# Patient Record
Sex: Female | Born: 1960 | Race: White | Hispanic: No | Marital: Married | Smoking: Never smoker
Health system: Southern US, Community
[De-identification: ages and names within clinical notes are randomized; demographics above are authoritative.]

## PROBLEM LIST (undated history)

## (undated) HISTORY — PX: ABDOMINAL HYSTERECTOMY: SHX81

## (undated) HISTORY — PX: AUGMENTATION MAMMAPLASTY: SUR837

---

## 1998-01-31 ENCOUNTER — Other Ambulatory Visit: Admission: RE | Admit: 1998-01-31 | Discharge: 1998-01-31 | Payer: Self-pay | Admitting: Obstetrics and Gynecology

## 1998-08-14 ENCOUNTER — Inpatient Hospital Stay (HOSPITAL_COMMUNITY): Admission: AD | Admit: 1998-08-14 | Discharge: 1998-08-16 | Payer: Self-pay | Admitting: Obstetrics and Gynecology

## 1998-09-17 ENCOUNTER — Other Ambulatory Visit: Admission: RE | Admit: 1998-09-17 | Discharge: 1998-09-17 | Payer: Self-pay | Admitting: Obstetrics and Gynecology

## 1998-10-15 ENCOUNTER — Other Ambulatory Visit: Admission: RE | Admit: 1998-10-15 | Discharge: 1998-10-15 | Payer: Self-pay | Admitting: Obstetrics and Gynecology

## 1999-05-20 ENCOUNTER — Other Ambulatory Visit: Admission: RE | Admit: 1999-05-20 | Discharge: 1999-05-20 | Payer: Self-pay | Admitting: Obstetrics and Gynecology

## 1999-05-21 ENCOUNTER — Other Ambulatory Visit: Admission: RE | Admit: 1999-05-21 | Discharge: 1999-05-21 | Payer: Self-pay | Admitting: Obstetrics and Gynecology

## 1999-05-21 ENCOUNTER — Encounter (INDEPENDENT_AMBULATORY_CARE_PROVIDER_SITE_OTHER): Payer: Self-pay | Admitting: Specialist

## 1999-09-12 ENCOUNTER — Other Ambulatory Visit: Admission: RE | Admit: 1999-09-12 | Discharge: 1999-09-12 | Payer: Self-pay | Admitting: Obstetrics and Gynecology

## 1999-09-17 ENCOUNTER — Ambulatory Visit: Admission: RE | Admit: 1999-09-17 | Discharge: 1999-09-17 | Payer: Self-pay | Admitting: Obstetrics and Gynecology

## 2000-03-27 ENCOUNTER — Inpatient Hospital Stay (HOSPITAL_COMMUNITY): Admission: AD | Admit: 2000-03-27 | Discharge: 2000-03-30 | Payer: Self-pay | Admitting: Obstetrics and Gynecology

## 2001-02-04 ENCOUNTER — Other Ambulatory Visit: Admission: RE | Admit: 2001-02-04 | Discharge: 2001-02-04 | Payer: Self-pay | Admitting: Obstetrics and Gynecology

## 2001-09-09 ENCOUNTER — Other Ambulatory Visit: Admission: RE | Admit: 2001-09-09 | Discharge: 2001-09-09 | Payer: Self-pay | Admitting: Obstetrics and Gynecology

## 2002-09-29 ENCOUNTER — Other Ambulatory Visit: Admission: RE | Admit: 2002-09-29 | Discharge: 2002-09-29 | Payer: Self-pay | Admitting: Obstetrics and Gynecology

## 2004-06-13 ENCOUNTER — Other Ambulatory Visit: Admission: RE | Admit: 2004-06-13 | Discharge: 2004-06-13 | Payer: Self-pay | Admitting: Obstetrics and Gynecology

## 2004-06-28 ENCOUNTER — Ambulatory Visit: Payer: Self-pay | Admitting: Internal Medicine

## 2005-03-17 ENCOUNTER — Emergency Department: Payer: Self-pay | Admitting: Emergency Medicine

## 2005-03-17 ENCOUNTER — Ambulatory Visit: Payer: Self-pay | Admitting: Emergency Medicine

## 2005-07-22 ENCOUNTER — Other Ambulatory Visit: Admission: RE | Admit: 2005-07-22 | Discharge: 2005-07-22 | Payer: Self-pay | Admitting: Obstetrics and Gynecology

## 2007-09-18 ENCOUNTER — Encounter: Payer: Self-pay | Admitting: Internal Medicine

## 2007-09-18 ENCOUNTER — Emergency Department: Payer: Self-pay | Admitting: Emergency Medicine

## 2007-09-21 ENCOUNTER — Ambulatory Visit: Payer: Self-pay | Admitting: Internal Medicine

## 2007-09-21 DIAGNOSIS — R1013 Epigastric pain: Secondary | ICD-10-CM | POA: Insufficient documentation

## 2008-02-14 ENCOUNTER — Emergency Department: Payer: Self-pay | Admitting: Emergency Medicine

## 2008-02-15 ENCOUNTER — Ambulatory Visit: Payer: Self-pay | Admitting: Family Medicine

## 2008-02-15 ENCOUNTER — Encounter: Admission: RE | Admit: 2008-02-15 | Discharge: 2008-02-15 | Payer: Self-pay | Admitting: Family Medicine

## 2008-02-15 DIAGNOSIS — M25569 Pain in unspecified knee: Secondary | ICD-10-CM | POA: Insufficient documentation

## 2008-02-15 DIAGNOSIS — M2390 Unspecified internal derangement of unspecified knee: Secondary | ICD-10-CM | POA: Insufficient documentation

## 2008-02-16 ENCOUNTER — Encounter: Payer: Self-pay | Admitting: Family Medicine

## 2009-03-16 ENCOUNTER — Encounter (INDEPENDENT_AMBULATORY_CARE_PROVIDER_SITE_OTHER): Payer: Self-pay | Admitting: Obstetrics and Gynecology

## 2009-03-16 ENCOUNTER — Ambulatory Visit (HOSPITAL_COMMUNITY): Admission: RE | Admit: 2009-03-16 | Discharge: 2009-03-17 | Payer: Self-pay | Admitting: Obstetrics and Gynecology

## 2009-05-01 ENCOUNTER — Encounter: Admission: RE | Admit: 2009-05-01 | Discharge: 2009-05-01 | Payer: Self-pay | Admitting: Obstetrics and Gynecology

## 2010-03-26 ENCOUNTER — Ambulatory Visit: Payer: Self-pay | Admitting: Family Medicine

## 2010-03-27 ENCOUNTER — Encounter: Payer: Self-pay | Admitting: Family Medicine

## 2010-06-04 NOTE — Assessment & Plan Note (Signed)
Summary: uti/jbb   Vital Signs:  Patient Profile:   50 Years Old Female CC:      UTI Height:     66 inches Weight:      157 pounds BMI:     25.43 O2 Sat:      100 % O2 treatment:    Room Air Temp:     98.6 degrees F oral Pulse rate:   64 / minute Pulse rhythm:   regular Resp:     18 per minute BP sitting:   138 / 91  (right arm)  Pt. in pain?   no  Vitals Entered By: Levonne Spiller EMT-P (March 26, 2010 11:25 AM)              Is Patient Diabetic? No CBG Result 73  Does patient need assistance? Functional Status Self care Ambulation Normal      Current Allergies: No known allergies History of Present Illness History from: patient Chief Complaint: UTI History of Present Illness: The patient has been concerned about a UTI because she was having symptoms of burning with urination and slightly increased frequency of urination.  No fever, chills, nausea or vomiting.  The patient also says that she has been drinking extra fluids for the last several days.   REVIEW OF SYSTEMS Constitutional Symptoms      Denies fever, chills, night sweats, weight loss, weight gain, and fatigue.  Eyes       Denies change in vision, eye pain, eye discharge, glasses, contact lenses, and eye surgery. Ear/Nose/Throat/Mouth       Denies hearing loss/aids, change in hearing, ear pain, ear discharge, dizziness, frequent runny nose, frequent nose bleeds, sinus problems, sore throat, hoarseness, and tooth pain or bleeding.  Respiratory       Denies dry cough, productive cough, wheezing, shortness of breath, asthma, bronchitis, and emphysema/COPD.  Cardiovascular       Denies murmurs, chest pain, and tires easily with exhertion.    Gastrointestinal       Denies stomach pain, nausea/vomiting, diarrhea, constipation, blood in bowel movements, and indigestion. Genitourniary       Complains of painful urination.      Denies kidney stones and loss of urinary control.      Comments: burning with  urination and urinary frequency Neurological       Denies paralysis, seizures, and fainting/blackouts. Musculoskeletal       Denies muscle pain, joint pain, joint stiffness, decreased range of motion, redness, swelling, muscle weakness, and gout.  Skin       Denies bruising, unusual mles/lumps or sores, and hair/skin or nail changes.  Psych       Denies mood changes, temper/anger issues, anxiety/stress, speech problems, depression, and sleep problems.  Past History:  Past Surgical History: Last updated: 02/15/2008 denies prior Orthopedic Surgery  Family History: Last updated: 03/26/2010 Mother - Autoimmune Disease Father - Deceased from Cancer Siblings - Alive and Well  Social History: Last updated: 03/26/2010 Occupation: Homemaker Married--2nd. 2 children from 1st marriage, 2 with this one, 3 step children Never Smoked Alcohol use-occ beer  Risk Factors: Smoking Status: never (09/21/2007)  Past Medical History: Previous problems with knees  Family History: Mother - Autoimmune Disease Father - Deceased from Cancer Siblings - Alive and Well  Social History: Occupation: Futures trader Married--2nd. 2 children from 1st marriage, 2 with this one, 3 step children Never Smoked Alcohol use-occ beer Physical Exam General appearance: well developed, well nourished, no acute distress Head: normocephalic, atraumatic Eyes:  conjunctivae and lids normal Pupils: equal, round, reactive to light Ears: normal, no lesions or deformities Nasal: mucosa pink, nonedematous, no septal deviation, turbinates normal Oral/Pharynx: tongue normal, posterior pharynx without erythema or exudate Neck: neck supple,  trachea midline, no masses Chest/Lungs: no rales, wheezes, or rhonchi bilateral, breath sounds equal without effort Heart: regular rate and  rhythm, no murmur Abdomen: soft, non-tender without obvious organomegaly GU: No cva tenderness Extremities: normal extremities Neurological:  grossly intact and non-focal Skin: no obvious rashes or lesions MSE: oriented to time, place, and person Assessment Problems:   KNEE PAIN, LEFT, ACUTE (ICD-719.46) INTERNAL DERANGEMENT, RIGHT KNEE (ICD-717.9) ABDOMINAL PAIN, EPIGASTRIC (ICD-789.06) New Problems: ELEVATED BP READING WITHOUT DX HYPERTENSION (ICD-796.2) URINARY TRACT INFECTION, ACUTE (ICD-599.0)   Patient Education: Patient and/or caregiver instructed in the following: fluids. The risks, benefits and possible side effects were clearly explained and discussed with the patient.  The patient verbalized clear understanding.  The patient was given instructions to return if symptoms don't improve, worsen or new changes develop.  If it is not during clinic hours and the patient cannot get back to this clinic then the patient was told to seek medical care at an available urgent care or emergency department.  The patient verbalized understanding.   Demonstrates willingness to comply.  Plan New Medications/Changes: SULFAMETHOXAZOLE-TMP DS 800-160 MG TABS (SULFAMETHOXAZOLE-TRIMETHOPRIM) take 1 by mouth two times a day  #10 x 0, 03/26/2010, Standley Dakins MD  Planning Comments:   Urine Culture Ordered  Follow Up: Follow up in 2-3 days if no improvement, Follow up with Primary Physician  The patient and/or caregiver has been counseled thoroughly with regard to medications prescribed including dosage, schedule, interactions, rationale for use, and possible side effects and they verbalize understanding.  Diagnoses and expected course of recovery discussed and will return if not improved as expected or if the condition worsens. Patient and/or caregiver verbalized understanding.  Prescriptions: SULFAMETHOXAZOLE-TMP DS 800-160 MG TABS (SULFAMETHOXAZOLE-TRIMETHOPRIM) take 1 by mouth two times a day  #10 x 0   Entered and Authorized by:   Standley Dakins MD   Signed by:   Standley Dakins MD on 03/26/2010   Method used:   Electronically  to        [facility] (retail)       [address], [address]       [place], [place]  [address]       Ph: [phone]       Fax: [phone]   RxID:   [id number]   Patient Instructions: 1)  The patient was informed that there is no on-call Johnette Teigen or services available at this clinic during off-hours (when the clinic is closed).  If the patient developed a problem or concern that required immediate attention, the patient was advised to go the the nearest available urgent care or emergency department for medical care.  The patient verbalized understanding.   2)  Take your antibiotic as prescribed until ALL of it is gone, but stop if you develop a rash or swelling and contact our office as soon as possible. 3)  Come have your blood pressure rechecked in 2 weeks when recovered from the infection.  4)  Oral Rehydration Solution: drink 1/2 ounce every 15 minutes. If tolerated afert 1 hour, drink 1 ounce every 15 minutes. As you can tolerate, keep adding 1/2 ounce every 15 minutes, up to a total of 2-4 ounces. Contact the office if  unable to tolerate oral solution, if you keep vomiting, or you continue to have signs of dehydration.

## 2010-08-07 LAB — CBC
HCT: 28.8 % — ABNORMAL LOW (ref 36.0–46.0)
HCT: 35.1 % — ABNORMAL LOW (ref 36.0–46.0)
Hemoglobin: 9.4 g/dL — ABNORMAL LOW (ref 12.0–15.0)
MCHC: 32.6 g/dL (ref 30.0–36.0)
MCV: 83.2 fL (ref 78.0–100.0)
Platelets: 393 10*3/uL (ref 150–400)
Platelets: 492 10*3/uL — ABNORMAL HIGH (ref 150–400)
RBC: 3.45 MIL/uL — ABNORMAL LOW (ref 3.87–5.11)
RDW: 14.8 % (ref 11.5–15.5)
RDW: 15.1 % (ref 11.5–15.5)
WBC: 12.7 10*3/uL — ABNORMAL HIGH (ref 4.0–10.5)

## 2010-08-07 LAB — COMPREHENSIVE METABOLIC PANEL
ALT: 31 U/L (ref 0–35)
AST: 28 U/L (ref 0–37)
Albumin: 4.2 g/dL (ref 3.5–5.2)
Calcium: 9.3 mg/dL (ref 8.4–10.5)
Chloride: 108 mEq/L (ref 96–112)
Creatinine, Ser: 0.77 mg/dL (ref 0.4–1.2)
GFR calc non Af Amer: >60 mL/min (ref >60)
Glucose, Bld: 86 mg/dL (ref 70–99)
Potassium: 4.1 mEq/L (ref 3.5–5.1)
Sodium: 138 mEq/L (ref 135–145)
Total Bilirubin: 0.3 mg/dL (ref 0.3–1.2)

## 2011-10-01 ENCOUNTER — Other Ambulatory Visit: Payer: Self-pay | Admitting: Obstetrics and Gynecology

## 2013-07-07 ENCOUNTER — Ambulatory Visit: Payer: Self-pay | Admitting: Family Medicine

## 2013-07-08 ENCOUNTER — Encounter: Payer: Self-pay | Admitting: Internal Medicine

## 2013-07-08 ENCOUNTER — Ambulatory Visit (INDEPENDENT_AMBULATORY_CARE_PROVIDER_SITE_OTHER): Payer: PRIVATE HEALTH INSURANCE | Admitting: Internal Medicine

## 2013-07-08 VITALS — BP 112/70 | HR 69 | Temp 97.9°F | Ht 65.5 in | Wt 160.0 lb

## 2013-07-08 DIAGNOSIS — IMO0002 Reserved for concepts with insufficient information to code with codable children: Secondary | ICD-10-CM

## 2013-07-08 DIAGNOSIS — S46919A Strain of unspecified muscle, fascia and tendon at shoulder and upper arm level, unspecified arm, initial encounter: Secondary | ICD-10-CM

## 2013-07-08 DIAGNOSIS — M5430 Sciatica, unspecified side: Secondary | ICD-10-CM

## 2013-07-08 DIAGNOSIS — R300 Dysuria: Secondary | ICD-10-CM

## 2013-07-08 LAB — POCT URINALYSIS DIPSTICK
Bilirubin, UA: NEGATIVE
GLUCOSE UA: NEGATIVE
Ketones, UA: NEGATIVE
NITRITE UA: NEGATIVE
PH UA: 6.5
PROTEIN UA: NORMAL
UROBILINOGEN UA: 0.2

## 2013-07-08 MED ORDER — PREDNISONE 10 MG PO TABS: ORAL_TABLET | ORAL | Status: DC

## 2013-07-08 MED ORDER — CIPROFLOXACIN HCL 500 MG PO TABS: 500.0000 mg | ORAL_TABLET | Freq: Two times a day (BID) | ORAL | Status: DC

## 2013-07-08 NOTE — Patient Instructions (Addendum)

## 2013-07-08 NOTE — Progress Notes (Signed)
Subjective:    Patient ID: Susan Stephens, female    DOB: 1960-12-27, 53 y.o.   MRN: [id number]  HPI  Pt presents to the clinic today with c/o back pain. She reports the pain starts in her lower back/upper glute and radiates down her right leg. This started about 6-8 weeks ago. She describes the pain as sharp, numbing and tingling. She reports the pain is worse with movement. She denies any specific injury that she is aware of. She has taken OTC Aleve and Ibuprofen. She also c/o a knot under her left shoulder blade. She reports this started 3-4 weeks ago. It is worse with sitting for long periods of time. She has been massaged by her husband who is a retired Art therapist. She has only taken the Aleve and Ibuprofen but it has not helped with pain in her upper back. Additionally, she c/o dysuria. She reports this started 2 weeks ago. She also c/o urgency and frequency. She denies fever, chills or back pain. She has not taken anything OTC.  Review of Systems      History reviewed. No pertinent past medical history.  No current outpatient prescriptions on file.   No current facility-administered medications for this visit.    No Known Allergies  History reviewed. No pertinent family history.  History   Social History  . Marital Status: Married    Spouse Name: N/A    Number of Children: N/A  . Years of Education: N/A   Occupational History  . Not on file.   Social History Main Topics  . Smoking status: Never Smoker   . Smokeless tobacco: Never Used  . Alcohol Use: Yes     Comment: occasional  . Drug Use: No  . Sexual Activity: Not on file   Other Topics Concern  . Not on file   Social History Narrative  . No narrative on file     Constitutional: Denies fever, malaise, fatigue, headache or abrupt weight changes.  GU: Pt reports urgency, frequency and dysuria. Denies burning sensation, blood in urine, odor or discharge. Musculoskeletal: Pt reports a knot on her upper  back and right leg pain. Denies decrease in range of motion, difficulty with gait, or joint pain and swelling.    No other specific complaints in a complete review of systems (except as listed in HPI above).  Objective:   Physical Exam   BP 112/70  Pulse 69  Temp(Src) 97.9 F (36.6 C) (Oral)  Ht 5' 5.5" (1.664 m)  Wt 160 lb (72.576 kg)  BMI 26.21 kg/m2  SpO2 98% Wt Readings from Last 3 Encounters:  07/08/13 160 lb (72.576 kg)  03/26/10 157 lb (71.215 kg)  09/21/07 164 lb (74.39 kg)    General: Appears her stated age, well developed, well nourished in NAD. Cardiovascular: Normal rate and rhythm. S1,S2 noted.  No murmur, rubs or gallops noted. No JVD or BLE edema. No carotid bruits noted. Pulmonary/Chest: Normal effort and positive vesicular breath sounds. No respiratory distress. No wheezes, rales or ronchi noted.  Abdomen: Soft and nontender. Normal bowel sounds, no bruits noted. No distention or masses noted. Liver, spleen and kidneys non palpable. No CVA tenderness. Musculoskeletal: Normal flexion and extension of the right leg. Positive straight leg raise. Very tense trapezoid noted medial to the left scapula, tender to touch.    BMET    Component Value Date/Time   NA 138 03/16/2009 0815   K 4.1 03/16/2009 0815   CL 108 03/16/2009 0815  CO2 25 03/16/2009 0815   GLUCOSE 86 03/16/2009 0815   BUN 9 03/16/2009 0815   CREATININE 0.77 03/16/2009 0815   CALCIUM 9.3 03/16/2009 0815   GFRNONAA >60 03/16/2009 0815   GFRAA  Value: >60        The eGFR has been calculated using the MDRD equation. This calculation has not been validated in all clinical situations. eGFR's persistently <60 mL/min signify possible Chronic Kidney Disease. 03/16/2009 0815    Lipid Panel  No results found for this basename: chol, trig, hdl, cholhdl, vldl, ldlcalc    CBC    Component Value Date/Time   WBC 12.7* 03/17/2009 0530   RBC 3.45* 03/17/2009 0530   HGB 9.4 DELTA CHECK NOTED PHONED B  GRAHAM 0625 03/17/09 BY A POTEAT* 03/17/2009 0530   HCT 28.8* 03/17/2009 0530   PLT 393 03/17/2009 0530   MCV 83.6 03/17/2009 0530   MCHC 32.6 03/17/2009 0530   RDW 15.1 03/17/2009 0530    Hgb A1C No results found for this basename: HGBA1C        Assessment & Plan:   Sciatica Neuralgia:  eRx for pred taper x 9 days Stretching exercises given Ok to take tylenol in addition to pred taper if needed  Muscle Tension of left upper back:  Advised her she probably needs a muscle relaxer-she declines She feels like the pred taper will take care of the inflammation  Dysuria:  Urinalysis: mod leuks and mod blood eRx for Cipro BID x 5 days  I encouraged her to make a new pt appt, she declines at this time.

## 2013-07-11 ENCOUNTER — Telehealth: Payer: Self-pay

## 2013-07-11 NOTE — Telephone Encounter (Signed)
She has to reestablish with Dr. Alphonsus SiasLetvak before we can starting ordering MRI's and referrals per Carlena SaxBlair

## 2013-07-11 NOTE — Telephone Encounter (Signed)
Pt left v/m; pt seen 07/08/13 and pt is no better; pt request MRI ordered or referral to specialist that treats sciatica. Pt has not scheduled new pt to re establish.Please advise.

## 2013-07-12 NOTE — Telephone Encounter (Signed)
Pt is aware and has an upcoming appt with Dr Alphonsus SiasLetvak

## 2013-07-14 ENCOUNTER — Telehealth: Payer: Self-pay | Admitting: Internal Medicine

## 2013-07-14 NOTE — Telephone Encounter (Signed)
Pt called and request referral to neurosurgeon today. Pt cannot move now and pain has increased. Pt states she cannot wait until Monday to see Dr. Alphonsus SiasLetvak. Pt request call back today. I informed pt that referrals do take some time. Please advise.

## 2013-07-14 NOTE — Telephone Encounter (Signed)
I have spoken to the pt and told her that she must be seen to re establish before anything could be done--she has an upcoming appt on 07/18/13

## 2013-07-14 NOTE — Telephone Encounter (Signed)
She has to reestablish care in order for me to put in referrals. If needed, she can discuss with Hansel StarlingAdrienne. And if she can't move, I suggest she go to the ER.

## 2013-07-18 ENCOUNTER — Ambulatory Visit: Payer: PRIVATE HEALTH INSURANCE | Admitting: Internal Medicine

## 2013-08-08 ENCOUNTER — Ambulatory Visit: Payer: Self-pay | Admitting: Neurology

## 2013-09-20 ENCOUNTER — Encounter: Payer: Self-pay | Admitting: Neurology

## 2013-10-12 ENCOUNTER — Other Ambulatory Visit: Payer: Self-pay | Admitting: Obstetrics and Gynecology

## 2013-10-12 DIAGNOSIS — N63 Unspecified lump in unspecified breast: Secondary | ICD-10-CM

## 2013-10-21 ENCOUNTER — Other Ambulatory Visit: Payer: Self-pay | Admitting: Obstetrics and Gynecology

## 2013-10-21 ENCOUNTER — Encounter (INDEPENDENT_AMBULATORY_CARE_PROVIDER_SITE_OTHER): Payer: Self-pay

## 2013-10-21 ENCOUNTER — Ambulatory Visit
Admission: RE | Admit: 2013-10-21 | Discharge: 2013-10-21 | Disposition: A | Discharge status: Home / Self Care | Payer: PRIVATE HEALTH INSURANCE | Source: Ambulatory Visit | Attending: Obstetrics and Gynecology | Admitting: Obstetrics and Gynecology

## 2013-10-21 DIAGNOSIS — N63 Unspecified lump in unspecified breast: Secondary | ICD-10-CM

## 2014-02-13 ENCOUNTER — Other Ambulatory Visit: Payer: Self-pay | Admitting: Neurosurgery

## 2014-02-13 DIAGNOSIS — M5127 Other intervertebral disc displacement, lumbosacral region: Secondary | ICD-10-CM

## 2014-02-23 ENCOUNTER — Ambulatory Visit
Admission: RE | Admit: 2014-02-23 | Discharge: 2014-02-23 | Disposition: A | Discharge status: Home / Self Care | Payer: PRIVATE HEALTH INSURANCE | Source: Ambulatory Visit | Attending: Neurosurgery | Admitting: Neurosurgery

## 2014-02-23 DIAGNOSIS — M5127 Other intervertebral disc displacement, lumbosacral region: Secondary | ICD-10-CM

## 2014-02-23 MED ORDER — GADOBENATE DIMEGLUMINE 529 MG/ML IV SOLN
14.0000 mL | Freq: Once | INTRAVENOUS | Status: AC | PRN
  Administered 2014-02-23: 14 mL via INTRAVENOUS

## 2014-04-11 ENCOUNTER — Other Ambulatory Visit (HOSPITAL_COMMUNITY): Payer: Self-pay | Admitting: Neurosurgery

## 2014-04-11 DIAGNOSIS — M5127 Other intervertebral disc displacement, lumbosacral region: Secondary | ICD-10-CM

## 2014-04-11 SURGERY — Surgical Case
Anesthesia: *Unknown

## 2014-04-20 ENCOUNTER — Ambulatory Visit (HOSPITAL_COMMUNITY)
Admission: RE | Admit: 2014-04-20 | Discharge: 2014-04-20 | Disposition: A | Discharge status: Home / Self Care | Payer: PRIVATE HEALTH INSURANCE | Source: Ambulatory Visit | Attending: Neurosurgery | Admitting: Neurosurgery

## 2014-04-20 VITALS — BP 113/68 | HR 63 | Temp 98.2°F | Resp 20 | Ht 65.0 in | Wt 156.0 lb

## 2014-04-20 DIAGNOSIS — M25552 Pain in left hip: Secondary | ICD-10-CM | POA: Insufficient documentation

## 2014-04-20 DIAGNOSIS — M4806 Spinal stenosis, lumbar region: Secondary | ICD-10-CM | POA: Insufficient documentation

## 2014-04-20 DIAGNOSIS — M62830 Muscle spasm of back: Secondary | ICD-10-CM | POA: Insufficient documentation

## 2014-04-20 DIAGNOSIS — M545 Low back pain: Secondary | ICD-10-CM | POA: Insufficient documentation

## 2014-04-20 DIAGNOSIS — M25551 Pain in right hip: Secondary | ICD-10-CM | POA: Insufficient documentation

## 2014-04-20 DIAGNOSIS — M5127 Other intervertebral disc displacement, lumbosacral region: Secondary | ICD-10-CM

## 2014-04-20 DIAGNOSIS — M8938 Hypertrophy of bone, other site: Secondary | ICD-10-CM | POA: Insufficient documentation

## 2014-04-20 MED ORDER — DIAZEPAM 5 MG PO TABS
5.0000 mg | ORAL_TABLET | Freq: Once | ORAL | Status: AC
  Administered 2014-04-20: 5 mg via ORAL

## 2014-04-20 MED ORDER — ONDANSETRON HCL 4 MG/2ML IJ SOLN: 4.0000 mg | Freq: Four times a day (QID) | INTRAMUSCULAR | Status: DC | PRN

## 2014-04-20 MED ORDER — IOHEXOL 180 MG/ML  SOLN
20.0000 mL | Freq: Once | INTRAMUSCULAR | Status: AC | PRN
Start: 2014-04-20 — End: 2014-04-20
  Administered 2014-04-20: 20 mL via INTRATHECAL

## 2014-04-20 MED ORDER — OXYCODONE HCL 5 MG PO TABS
5.0000 mg | ORAL_TABLET | ORAL | Status: DC | PRN
  Filled 2014-04-20: qty 2

## 2014-04-20 MED ORDER — DIAZEPAM 5 MG PO TABS
ORAL_TABLET | ORAL | Status: AC
  Filled 2014-04-20: qty 1

## 2014-04-20 NOTE — Discharge Instructions (Signed)
Myelography, Care After °These instructions give you information on caring for yourself after your procedure. Your doctor may also give you specific instructions. Call your doctor if you have any problems or questions after your procedure. °HOME CARE °· Rest often the first day. °· When you rest, lie flat, with your head slightly raised (elevated). °· Avoid heavy lifting and activity for 48 hours. °· You may take the bandage (dressing) off 1 day after the test. °GET HELP RIGHT AWAY IF:  °· You have a very bad headache. °· You have a fever. °MAKE SURE YOU: °· Understand these instructions. °· Will watch your condition. °· Will get help right away if you are not doing well or get worse. °Document Released: 01/29/2008 Document Revised: 09/05/2013 Document Reviewed: 08/10/2013 °ExitCare® Patient Information ©2015 [employer]. This information is not intended to replace advice given to you by your health care provider. Make sure you discuss any questions you have with your health care provider. ° ° °

## 2014-04-24 ENCOUNTER — Other Ambulatory Visit: Payer: Self-pay | Admitting: Neurosurgery

## 2014-04-24 ENCOUNTER — Ambulatory Visit
Admission: RE | Admit: 2014-04-24 | Discharge: 2014-04-24 | Disposition: A | Discharge status: Home / Self Care | Payer: PRIVATE HEALTH INSURANCE | Source: Ambulatory Visit | Attending: Neurosurgery | Admitting: Neurosurgery

## 2014-04-24 DIAGNOSIS — G971 Other reaction to spinal and lumbar puncture: Secondary | ICD-10-CM

## 2014-04-24 MED ORDER — IOHEXOL 180 MG/ML  SOLN
1.0000 mL | Freq: Once | INTRAMUSCULAR | Status: AC | PRN
  Administered 2014-04-24: 1 mL via EPIDURAL

## 2014-04-24 NOTE — Discharge Instructions (Signed)
Blood patch Discharge Instructions ° °1. Go home and rest quietly for the next 24 hours.  It is important to lie flat for the next 24 hours.  Get up only to go to the restroom.  You may lie in the bed or on a couch on your back, your stomach, your left side or your right side.  You may have one pillow under your head.  You may have pillows between your knees while you are on your side or under your knees while you are on your back. ° °2. DO NOT drive today.  Recline the seat as far back as it will go, while still wearing your seat belt, on the way home. ° °3. You may get up to go to the bathroom as needed.  You may sit up for 10 minutes to eat.  You may resume your normal diet and medications unless otherwise indicated.  Drink lots of extra fluids today and tomorrow.  ° °4. You may resume normal activities after your 24 hours of bed rest is over; however, do not exert yourself strongly or do any heavy lifting tomorrow. ° °5. Call your physician for a follow-up appointment.  ° °6. If you have any questions  after you arrive home, please call [phone]. ° °Discharge instructions have been explained to the patient.  The patient, or the person responsible for the patient, fully understands these instructions. °

## 2014-04-24 NOTE — Progress Notes (Signed)
Discharge instructions explained to pt. 20 cc's of blood drawn from left Ac. Site is unremarkable and pt tolerated procedure well.

## 2014-05-30 ENCOUNTER — Other Ambulatory Visit: Payer: Self-pay

## 2014-05-30 ENCOUNTER — Other Ambulatory Visit: Payer: Self-pay | Admitting: Obstetrics and Gynecology

## 2014-05-30 DIAGNOSIS — Z1231 Encounter for screening mammogram for malignant neoplasm of breast: Secondary | ICD-10-CM

## 2014-06-12 ENCOUNTER — Ambulatory Visit
Admission: RE | Admit: 2014-06-12 | Discharge: 2014-06-12 | Disposition: A | Discharge status: Home / Self Care | Payer: PRIVATE HEALTH INSURANCE | Source: Ambulatory Visit

## 2014-06-12 DIAGNOSIS — Z1231 Encounter for screening mammogram for malignant neoplasm of breast: Secondary | ICD-10-CM

## 2014-06-29 ENCOUNTER — Other Ambulatory Visit: Payer: Self-pay | Admitting: Obstetrics and Gynecology

## 2014-06-30 LAB — CYTOLOGY - PAP

## 2014-07-12 ENCOUNTER — Other Ambulatory Visit: Payer: Self-pay | Admitting: Neurosurgery

## 2014-07-12 ENCOUNTER — Ambulatory Visit
Admission: RE | Admit: 2014-07-12 | Discharge: 2014-07-12 | Disposition: A | Discharge status: Home / Self Care | Payer: PRIVATE HEALTH INSURANCE | Source: Ambulatory Visit | Attending: Neurosurgery | Admitting: Neurosurgery

## 2014-07-12 DIAGNOSIS — M25552 Pain in left hip: Principal | ICD-10-CM

## 2014-07-12 DIAGNOSIS — M25551 Pain in right hip: Secondary | ICD-10-CM

## 2014-08-23 ENCOUNTER — Ambulatory Visit (INDEPENDENT_AMBULATORY_CARE_PROVIDER_SITE_OTHER): Payer: PRIVATE HEALTH INSURANCE | Admitting: Family Medicine

## 2014-08-23 ENCOUNTER — Encounter: Payer: Self-pay | Admitting: Family Medicine

## 2014-08-23 VITALS — BP 108/70 | HR 61 | Ht 65.0 in | Wt 160.0 lb

## 2014-08-23 DIAGNOSIS — M9903 Segmental and somatic dysfunction of lumbar region: Secondary | ICD-10-CM | POA: Diagnosis not present

## 2014-08-23 DIAGNOSIS — M533 Sacrococcygeal disorders, not elsewhere classified: Secondary | ICD-10-CM | POA: Diagnosis not present

## 2014-08-23 DIAGNOSIS — M9904 Segmental and somatic dysfunction of sacral region: Secondary | ICD-10-CM | POA: Diagnosis not present

## 2014-08-23 DIAGNOSIS — M9905 Segmental and somatic dysfunction of pelvic region: Secondary | ICD-10-CM

## 2014-08-23 DIAGNOSIS — M999 Biomechanical lesion, unspecified: Secondary | ICD-10-CM

## 2014-08-23 MED ORDER — DICLOFENAC SODIUM 2 % TD SOLN: TRANSDERMAL | Status: DC

## 2014-08-23 NOTE — Patient Instructions (Addendum)
Good to see you.  Ice 20 minutes 2 times daily. Usually after activity and before bed. Pennsaid twice daily as needed Sacroiliac Joint Mobilization and Rehab 1. Work on pretzel stretching, shoulder back and leg draped in front. 3-5 sets, 30 sec.. 2. hip abductor rotations. standing, hip flexion and rotation outward then inward. 3 sets, 15 reps. when can do comfortably, add ankle weights starting at 2 pounds.  3. cross over stretching - shoulder back to ground, same side leg crossover. 3-5 sets for 30 min..  4. rolling up and back knees to chest and rocking. 5. sacral tilt - 5 sets, hold for 5-10 seconds Exercises on wall.  Heel and butt touching.  Raise leg 6 inches and hold 2 seconds.  Down slow for count of 4 seconds.  1 set of 30 reps daily on both sides.  Good shoes with rigid bottom.  Dierdre HarnessKeen, Dansko, Merrell or New balance greater then 700 Look in to [place] See me agai nin 2-3 weeks.

## 2014-08-23 NOTE — Progress Notes (Signed)
Pre visit review using our clinic review tool, if applicable. No additional management support is needed unless otherwise documented below in the visit note. 

## 2014-08-23 NOTE — Assessment & Plan Note (Signed)
Decision today to treat with OMT was based on Physical Exam  After verbal consent patient was treated with HVLA, ME techniques in lumbar, sacral and pelvis areas  Patient tolerated the procedure well with improvement in symptoms  Patient given exercises, stretches and lifestyle modifications  See medications in patient instructions if given  Patient will follow up in 3 weeks

## 2014-08-23 NOTE — Assessment & Plan Note (Addendum)
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 I think that this is unfortunately giving her most of her difficulty. Patient has been treated with manual manipulation previously and we did make some other significant changes. Patient did respond well to osteopathic manipulation. Patient given trial of topical anti-inflammatories. We discussed icing regimen and home exercises in great detail. Patient is in a make these different changes and come back and see me again in 2-3 weeks. We discussed over-the-counter natural supplementations a could be beneficial as well. Based on patient's symptoms to differential also includes a bursitis that in the area of the pain there is no significant bursitis. Believe the patient did have a ligamentous injury and unfortunately secondary to this this is causing patient have increased mobility of the joint.

## 2014-08-23 NOTE — Progress Notes (Signed)
Susan Stephens [address] [place], [place] [address] Phone: [phone] Subjective:    I'm seeing this patient by the request  of:  Susan Abide, MD   CC: left SI pain  Susan Stephens is a 54 y.o. female coming in with complaint of  Sciatic type pain. Patient has a past medical history significant for back pain dating back to last year. Patient did have a significant workup including an MRI of the lumbar spine as well as a myelogram. 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 Patient did have an epidural back in December 2015 and states since her intervention she had been doing much better on the right leg with no numbness.  Patient though is unfortunately continued to have more of a left-sided low back pain. Patient does have a chiropractor for her husband and they have been treating patient sacroiliac joint. Patient states unfortunately no it does not seem to get significantly better. Patient states that unfortunate she has a chronic pain in this side with no radiation. Patient states it stays very localized over the left SI joint. Patient states with activities seems to get better than unfortunately get significantly worse after sitting a long amount of time. Patient then states that she can start moving and seems to be somewhat better. Patient rates the severity of more of a 5-7 out of 10. States that it is difficulty even roll over onto her right side secondary to the pain. No weakness of the lower extremity.  No past medical history on file. Past Surgical History  Procedure Laterality Date  . Abdominal hysterectomy     History   Social History  . Marital Status: Married    Spouse Name: N/A  . Number of Children: N/A  . Years of Education: N/A   Occupational History  . Not on file.   Social History Main Topics  . Smoking status: Never Smoker    . Smokeless tobacco: Never Used  . Alcohol Use: Yes     Comment: occasional  . Drug Use: No  . Sexual Activity: Not on file   Other Topics Concern  . Not on file   Social History Narrative  . No narrative on file   Allergies  Allergen Reactions  . Latex     Causes irritation  No family history on file.     Past medical history, social, surgical and family history all reviewed in electronic medical record.   Review of Systems: No headache, visual changes, nausea, vomiting, diarrhea, constipation, dizziness, abdominal pain, skin rash, fevers, chills, night sweats, weight loss, swollen lymph nodes, body aches, joint swelling, muscle aches, chest pain, shortness of breath, mood changes.   Objective Blood pressure 108/70, pulse 61, height  (1.651 m), weight 160 lb (72.576 kg), SpO2 98 %.  General: No apparent distress alert and oriented x3 mood and affect normal, dressed appropriately.  HEENT: Pupils equal, extraocular movements intact  Respiratory: Patient's speak in full sentences and does not appear short of breath  Cardiovascular: No lower extremity edema, non tender, no erythema  Skin: Warm dry intact with no signs of infection or rash on extremities or on axial skeleton.  Abdomen: Soft nontender  Neuro: Cranial nerves II through XII are intact, neurovascularly intact in all extremities with 2+ DTRs and 2+ pulses.  Lymph: No lymphadenopathy of posterior or anterior cervical chain  or axillae bilaterally.  Gait normal with good balance and coordination.  MSK:  Non tender with full range of motion and good stability and symmetric strength and tone of shoulders, elbows, wrist, hip, knee and ankles bilaterally.  Back Exam:  Inspection: Unremarkable  Motion: Flexion 45 deg, Extension 45 deg, Side Bending to 45 deg bilaterally,  Rotation to 45 deg bilaterally  SLR laying: Negative  XSLR laying: Negative  Palpable tenderness: Tender over left SI joint and minorly over the  piriformis FABER: Positive Faber left. Sensory change: Gross sensation intact to all lumbar and sacral dermatomes.  Reflexes: 2+ at both patellar tendons, 2+ at achilles tendons, Babinski's downgoing.  Strength at foot  Plantar-flexion: 5/5 Dorsi-flexion: 5/5 Eversion: 5/5 Inversion: 5/5  Leg strength  Quad: 5/5 Hamstring: 5/5 Hip flexor: 5/5 Hip abductors: 4/5  Gait unremarkable.  Osteopathic findings Lumbar L5 flexed rotated and side bent left Sacrum Right on right Iliac Posterior right ilium Mild pelvic shear    Impression and Recommendations:     This case required medical decision making of moderate complexity.

## 2014-09-08 ENCOUNTER — Other Ambulatory Visit (INDEPENDENT_AMBULATORY_CARE_PROVIDER_SITE_OTHER): Payer: PRIVATE HEALTH INSURANCE

## 2014-09-08 ENCOUNTER — Encounter: Payer: Self-pay | Admitting: Family Medicine

## 2014-09-08 ENCOUNTER — Ambulatory Visit (INDEPENDENT_AMBULATORY_CARE_PROVIDER_SITE_OTHER): Payer: PRIVATE HEALTH INSURANCE | Admitting: Family Medicine

## 2014-09-08 VITALS — BP 120/82 | HR 54 | Ht 65.0 in | Wt 159.0 lb

## 2014-09-08 DIAGNOSIS — M9903 Segmental and somatic dysfunction of lumbar region: Secondary | ICD-10-CM

## 2014-09-08 DIAGNOSIS — M533 Sacrococcygeal disorders, not elsewhere classified: Secondary | ICD-10-CM

## 2014-09-08 DIAGNOSIS — M9904 Segmental and somatic dysfunction of sacral region: Secondary | ICD-10-CM | POA: Diagnosis not present

## 2014-09-08 DIAGNOSIS — M9905 Segmental and somatic dysfunction of pelvic region: Secondary | ICD-10-CM | POA: Diagnosis not present

## 2014-09-08 DIAGNOSIS — M999 Biomechanical lesion, unspecified: Secondary | ICD-10-CM

## 2014-09-08 NOTE — Progress Notes (Signed)
Pre visit review using our clinic review tool, if applicable. No additional management support is needed unless otherwise documented below in the visit note. 

## 2014-09-08 NOTE — Assessment & Plan Note (Signed)
She was given an injection today in the left sacroiliac joint. Am hoping that this will be very beneficial. It can also be diagnostic. Patient does not have any significant improvement with this we may need to consider injection in the piriformis causing more of the rotation of the sacrum. Encourage patient to continue home exercises the icing protocol as well as the oral anti-inflammatories. Patient and will come back and see me again in 2-3 weeks for further evaluation and treatment.

## 2014-09-08 NOTE — Assessment & Plan Note (Signed)
Decision today to treat with OMT was based on Physical Exam  After verbal consent patient was treated with HVLA, ME techniques in lumbar, sacral and pelvis areas  Patient tolerated the procedure well with improvement in symptoms  Patient given exercises, stretches and lifestyle modifications  See medications in patient instructions if given  Patient will follow up in 2-3 weeks

## 2014-09-08 NOTE — Progress Notes (Signed)
Tawana ScaleZach Smith D.O. [facility] [address] [place], [place] [address] Phone: [phone]) [phone] Subjective:    I'm seeing this patient by the request  of:  Tillman Abideichard Letvak, MD   CC: left SI pain  BJY:NWGNFAOZHYHPI:Subjective [name] is a 54 y.o. female coming in with complaint of  Sciatic type pain. Patient has a past medical history significant for back pain dating back to last year. Patient did have a significant workup including an MRI of the lumbar spine as well as a myelogram. 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 Patient did have an epidural back in December 2015 and states since her intervention she had been doing much better on the right leg with no numbness.  Patient was seen by me and was diagnosed with more of a sacroiliac joint dysfunction. Especially of the left side. Patient did respond fairly well to osteopathic manipulation and given home exercises. We discussed over-the-counter medications as well. Patient statesshe has not made any significant improvement. Patient continues to have the pain mostly on the lateral aspect as well as the posterior aspect over the left sacroiliac joint. Patient states that she has been adamant with the exercises and the icing protocol.  No past medical history on file. Past Surgical History  Procedure Laterality Date  . Abdominal hysterectomy     History   Social History  . Marital Status: Married    Spouse Name: N/A  . Number of Children: N/A  . Years of Education: N/A   Occupational History  . Not on file.   Social History Main Topics  . Smoking status: Never Smoker   . Smokeless tobacco: Never Used  . Alcohol Use: Yes     Comment: occasional  . Drug Use: No  . Sexual Activity: Not on file   Other Topics Concern  . Not on file   Social History Narrative   Allergies  Allergen Reactions  . Latex     Causes irritation  No family  history on file.     Past medical history, social, surgical and family history all reviewed in electronic medical record.   Review of Systems: No headache, visual changes, nausea, vomiting, diarrhea, constipation, dizziness, abdominal pain, skin rash, fevers, chills, night sweats, weight loss, swollen lymph nodes, body aches, joint swelling, muscle aches, chest pain, shortness of breath, mood changes.   Objective Blood pressure 120/82, pulse 54, height 5\' 5"  (1.651 m), weight 159 lb (72.122 kg), SpO2 96 %.  General: No apparent distress alert and oriented x3 mood and affect normal, dressed appropriately.  HEENT: Pupils equal, extraocular movements intact  Respiratory: Patient's speak in full sentences and does not appear short of breath  Cardiovascular: No lower extremity edema, non tender, no erythema  Skin: Warm dry intact with no signs of infection or rash on extremities or on axial skeleton.  Abdomen: Soft nontender  Neuro: Cranial nerves II through XII are intact, neurovascularly intact in all extremities with 2+ DTRs and 2+ pulses.  Lymph: No lymphadenopathy of posterior or anterior cervical chain or axillae bilaterally.  Gait normal with good balance and coordination.  MSK:  Non tender with full range of motion and good stability and symmetric strength and tone of shoulders, elbows, wrist, hip, knee and ankles bilaterally.  Back Exam:  Inspection: Unremarkable  Motion: Flexion 45 deg, Extension 45 deg, Side Bending to 45 deg bilaterally,  Rotation  to 45 deg bilaterally  SLR laying: Negative  XSLR laying: Negative  Palpable tenderness: Tender over left SI joint and minorly over the piriformis FABER: Positive Faber left. Sensory change: Gross sensation intact to all lumbar and sacral dermatomes.  Reflexes: 2+ at both patellar tendons, 2+ at achilles tendons, Babinski's downgoing.  Strength at foot  Plantar-flexion: 5/5 Dorsi-flexion: 5/5 Eversion: 5/5 Inversion: 5/5  Leg strength   Quad: 5/5 Hamstring: 5/5 Hip flexor: 5/5 Hip abductors: 4/5  Gait unremarkable.  Osteopathic findings Lumbar L5 flexed rotated and side bent left Sacrum Right on right Iliac Posterior right ilium Mild pelvic shear  Procedure: Real-time Ultrasound Guided Injection of left sacroiliac joint Device: GE Logiq E  Ultrasound guided injection is preferred based studies that show increased duration, increased effect, greater accuracy, decreased procedural pain, increased response rate, and decreased cost with ultrasound guided versus blind injection.  Verbal informed consent obtained.  Time-out conducted.  Noted no overlying erythema, induration, or other signs of local infection.  Skin prepped in a sterile fashion.  Local anesthesia: Topical Ethyl chloride.  With sterile technique and under real time ultrasound guidance: with a 22-gauge 3-1/2 inch needle patient was injected with 2 mL of 0.5% Marcaine and 1 mL of Kenalog 40 mg/dL into the left sacroiliac joint.  Completed without difficulty  Pain immediately resolved suggesting accurate placement of the medication.  Advised to call if fevers/chills, erythema, induration, drainage, or persistent bleeding.  Images permanently stored and available for review in the ultrasound unit.  Impression: Technically successful ultrasound guided injection.    Impression and Recommendations:     This case required medical decision making of moderate complexity.

## 2014-09-08 NOTE — Patient Instructions (Addendum)
Good to see you We tried injection in left si joint No exercises today but can start tomorrow.  If not better in 2 weeks we will do injection in piriformis.

## 2014-09-22 ENCOUNTER — Other Ambulatory Visit (INDEPENDENT_AMBULATORY_CARE_PROVIDER_SITE_OTHER): Payer: PRIVATE HEALTH INSURANCE

## 2014-09-22 ENCOUNTER — Ambulatory Visit (INDEPENDENT_AMBULATORY_CARE_PROVIDER_SITE_OTHER): Payer: PRIVATE HEALTH INSURANCE | Admitting: Family Medicine

## 2014-09-22 ENCOUNTER — Encounter: Payer: Self-pay | Admitting: Family Medicine

## 2014-09-22 VITALS — BP 108/72 | HR 65 | Ht 65.0 in | Wt 155.0 lb

## 2014-09-22 DIAGNOSIS — M999 Biomechanical lesion, unspecified: Secondary | ICD-10-CM

## 2014-09-22 DIAGNOSIS — M9905 Segmental and somatic dysfunction of pelvic region: Secondary | ICD-10-CM

## 2014-09-22 DIAGNOSIS — G5702 Lesion of sciatic nerve, left lower limb: Secondary | ICD-10-CM | POA: Diagnosis not present

## 2014-09-22 DIAGNOSIS — M9903 Segmental and somatic dysfunction of lumbar region: Secondary | ICD-10-CM | POA: Diagnosis not present

## 2014-09-22 DIAGNOSIS — M533 Sacrococcygeal disorders, not elsewhere classified: Secondary | ICD-10-CM | POA: Diagnosis not present

## 2014-09-22 DIAGNOSIS — M9904 Segmental and somatic dysfunction of sacral region: Secondary | ICD-10-CM

## 2014-09-22 DIAGNOSIS — G5700 Lesion of sciatic nerve, unspecified lower limb: Secondary | ICD-10-CM | POA: Insufficient documentation

## 2014-09-22 NOTE — Patient Instructions (Addendum)
Good to see you Turmeric 500mg  twice daily Ice is your friend Conitnue the exercises and add 2# ankle weights See me again in 3-4 weeks.

## 2014-09-22 NOTE — Assessment & Plan Note (Signed)
I do believe that this is the underlying problem. I do think patient has some mild piriformis syndrome secondary to the rotation of the sacroiliac joint. Patient did respond very well to the injection and continues to do well. The patient have any worsening symptoms we'll consider a piriformis injection. Patient though will continue with the conservative therapy and patient will increase her activity and we discussed other over-the-counter medications in great detail. Patient and will come back and see me again in 3-4 weeks for further evaluation and treatment.

## 2014-09-22 NOTE — Progress Notes (Signed)
Pre visit review using our clinic review tool, if applicable. No additional management support is needed unless otherwise documented below in the visit note. 

## 2014-09-22 NOTE — Assessment & Plan Note (Signed)

## 2014-09-22 NOTE — Progress Notes (Signed)
Tawana ScaleZach Zoeann Mol D.O. [facility] [address] [place], [place] [address] Phone: [phone] Subjective:    I'm seeing this patient by the request  of:  Tillman Abideichard Letvak, MD   CC: left SI pain and piriformis follow-up  BJY:NWGNFAOZHYHPI:Subjective Solon AugustaJane F Barsamian is a 54 y.o. female coming in with complaint of  Sciatic type pain. Patient has a past medical history significant for back pain dating back to last year. Patient did have a significant workup including an MRI of the lumbar spine as well as a myelogram. 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 Patient did have an epidural back in December 2015 and states since her intervention she had been doing much better on the right leg with no numbness.  Patient was seen by me and was diagnosed with more of a sacroiliac joint dysfunction. Patient was also found have somewhat of a piriformis syndrome. Patient states that she continued to have some discomfort and wanted to be near pain-free. We attempted a sacroiliac joint injection. This was 2 weeks ago. Patient states the injection did help. Patient states that that is not giving her the pain during the day. Patient still has significant tightness in the morning. Patient states when rolling over at night or trying to get up she has severe pain for multiple minutes and then seems to get worse. Patient denies any radiation down the legs. Able to do daily activities and continues to stay very active. Patient did notice significant improvement with the Singulair and we'll continue to do so.   No past medical history on file. Past Surgical History  Procedure Laterality Date  . Abdominal hysterectomy     History   Social History  . Marital Status: Married    Spouse Name: N/A  . Number of Children: N/A  . Years of Education: N/A   Occupational History  . Not on file.   Social History Main Topics  . Smoking  status: Never Smoker   . Smokeless tobacco: Never Used  . Alcohol Use: Yes     Comment: occasional  . Drug Use: No  . Sexual Activity: Not on file   Other Topics Concern  . Not on file   Social History Narrative   Allergies  Allergen Reactions  . Latex     Causes irritation  No family history on file.     Past medical history, social, surgical and family history all reviewed in electronic medical record.   Review of Systems: No headache, visual changes, nausea, vomiting, diarrhea, constipation, dizziness, abdominal pain, skin rash, fevers, chills, night sweats, weight loss, swollen lymph nodes, body aches, joint swelling, muscle aches, chest pain, shortness of breath, mood changes.   Objective Blood pressure 108/72, pulse 65, height 5\' 5"  (1.651 m), weight 155 lb (70.308 kg), SpO2 97 %.  General: No apparent distress alert and oriented x3 mood and affect normal, dressed appropriately.  HEENT: Pupils equal, extraocular movements intact  Respiratory: Patient's speak in full sentences and does not appear short of breath  Cardiovascular: No lower extremity edema, non tender, no erythema  Skin: Warm dry intact with no signs of infection or rash on extremities or on axial skeleton.  Abdomen: Soft nontender  Neuro: Cranial nerves II through XII are intact, neurovascularly intact in all extremities with 2+ DTRs and 2+ pulses.  Lymph: No lymphadenopathy of posterior or anterior cervical chain or axillae bilaterally.  Gait normal with good balance and coordination.  MSK:  Non tender with full range of motion and good stability and symmetric strength and tone of shoulders, elbows, wrist, hip, knee and ankles bilaterally.  Back Exam:  Inspection: Unremarkable  Motion: Flexion 45 deg, Extension 45 deg, Side Bending to 45 deg bilaterally,  Rotation to 45 deg bilaterally  SLR laying: Negative  XSLR laying: Negative  Palpable tenderness: Tender over left SI joint and minorly over the  piriformis FABER: Positive Faber left. Sensory change: Gross sensation intact to all lumbar and sacral dermatomes.  Reflexes: 2+ at both patellar tendons, 2+ at achilles tendons, Babinski's downgoing.  Strength at foot  Plantar-flexion: 5/5 Dorsi-flexion: 5/5 Eversion: 5/5 Inversion: 5/5  Leg strength  Quad: 5/5 Hamstring: 5/5 Hip flexor: 5/5 Hip abductors: 4/5  Gait unremarkable.  Osteopathic findings Lumbar L5 flexed rotated and side bent left Sacrum Right on right Iliac Posterior right ilium       Impression and Recommendations:     This case required medical decision making of moderate complexity.

## 2014-09-22 NOTE — Assessment & Plan Note (Signed)
Decision today to treat with OMT was based on Physical Exam  After verbal consent patient was treated with HVLA, ME techniques in lumbar, sacral and pelvis areas  Patient tolerated the procedure well with improvement in symptoms  Patient given exercises, stretches and lifestyle modifications  See medications in patient instructions if given  Patient will follow up in 3-4 weeks  

## 2014-10-17 ENCOUNTER — Ambulatory Visit: Payer: PRIVATE HEALTH INSURANCE | Admitting: Family Medicine

## 2014-10-19 ENCOUNTER — Ambulatory Visit (INDEPENDENT_AMBULATORY_CARE_PROVIDER_SITE_OTHER): Payer: PRIVATE HEALTH INSURANCE | Admitting: Family Medicine

## 2014-10-19 ENCOUNTER — Other Ambulatory Visit (INDEPENDENT_AMBULATORY_CARE_PROVIDER_SITE_OTHER): Payer: PRIVATE HEALTH INSURANCE

## 2014-10-19 ENCOUNTER — Encounter: Payer: Self-pay | Admitting: Family Medicine

## 2014-10-19 VITALS — BP 122/70 | HR 86 | Ht 65.0 in | Wt 156.0 lb

## 2014-10-19 DIAGNOSIS — M9904 Segmental and somatic dysfunction of sacral region: Secondary | ICD-10-CM

## 2014-10-19 DIAGNOSIS — R21 Rash and other nonspecific skin eruption: Secondary | ICD-10-CM | POA: Diagnosis not present

## 2014-10-19 DIAGNOSIS — M2550 Pain in unspecified joint: Secondary | ICD-10-CM | POA: Diagnosis not present

## 2014-10-19 DIAGNOSIS — M533 Sacrococcygeal disorders, not elsewhere classified: Secondary | ICD-10-CM | POA: Diagnosis not present

## 2014-10-19 DIAGNOSIS — M9903 Segmental and somatic dysfunction of lumbar region: Secondary | ICD-10-CM

## 2014-10-19 DIAGNOSIS — M9905 Segmental and somatic dysfunction of pelvic region: Secondary | ICD-10-CM

## 2014-10-19 DIAGNOSIS — M999 Biomechanical lesion, unspecified: Secondary | ICD-10-CM

## 2014-10-19 LAB — C-REACTIVE PROTEIN: CRP: 0.2 mg/dL — AB (ref 0.5–20.0)

## 2014-10-19 MED ORDER — PREDNISONE 50 MG PO TABS: 50.0000 mg | ORAL_TABLET | Freq: Every day | ORAL | Status: DC

## 2014-10-19 NOTE — Progress Notes (Signed)
Pre visit review using our clinic review tool, if applicable. No additional management support is needed unless otherwise documented below in the visit note. 

## 2014-10-19 NOTE — Assessment & Plan Note (Signed)
Patient's rash could be secondary to more of a systemic illness or pathology. This seems to be more regular medication the patient takes minimal medications. Patient has been seen by dermatologist and has done a lot of elimination either through things and or environmental or food. Discussed with patient about different changes it could be beneficial. I do feel that further workup is necessary and labs ordered. Patient was given prednisone which she says that this has helped in the past.

## 2014-10-19 NOTE — Progress Notes (Signed)
[name] [address] [place], [place] [address] Phone: [phone] Subjective:    I'm seeing this patient by the request  of:  Tillman Abide, MD   CC: left SI pain and piriformis follow-up  [id number] Susan Stephens is a 54 y.o. female coming in with complaint of  Sciatic type pain. Patient has a past medical history significant for back pain dating back to last year. Patient did have a significant workup including an MRI of the lumbar spine as well as a myelogram. 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 Patient did have an epidural back in December 2015 and states since her intervention she had been doing much better on the right leg with no numbness.  Patient was seen by me and was diagnosed with more of a sacroiliac joint dysfunction. Patient was also found have somewhat of a piriformis syndrome. Patient states that she continued to have some discomfort and wanted to be near pain-free. We attempted a sacroiliac joint injection. Patient stated that this helped at last follow-up. Patient continued to have osteopathic manipulation continue to do the home exercises. Patient is coming in with worsening pain over the left sacroiliac joint. Patient feels that it is been out of place and is having significant inflammation. Patient has had this problem previously. Patient has responded osteopathic manipulation. Patient though has noticed some time that she has a flare with pain in the back she also gets a rash. Patient has tried an elimination diet without any significant improvement. States that the only thing that seems to help this rash has been prednisone in the past. Patient has been taking Singulair social is no longer itching. Patient states that this rash has been worse over the course last several days.   No past medical history on file. Past Surgical History    Procedure Laterality Date  . Abdominal hysterectomy     History   Social History  . Marital Status: Married    Spouse Name: N/A  . Number of Children: N/A  . Years of Education: N/A   Occupational History  . Not on file.   Social History Main Topics  . Smoking status: Never Smoker   . Smokeless tobacco: Never Used  . Alcohol Use: Yes     Comment: occasional  . Drug Use: No  . Sexual Activity: Not on file   Other Topics Concern  . Not on file   Social History Narrative   Allergies  Allergen Reactions  . Latex     Causes irritation  No family history on file.     Past medical history, social, surgical and family history all reviewed in electronic medical record.   Review of Systems: No headache, visual changes, nausea, vomiting, diarrhea, constipation, dizziness, abdominal pain, skin rash, fevers, chills, night sweats, weight loss, swollen lymph nodes, body aches, joint swelling, muscle aches, chest pain, shortness of breath, mood changes.   Objective Blood pressure 122/70, pulse 86, height 5\' 5"  (1.651 m), weight 156 lb (70.761 kg), SpO2 95 %.  General: No apparent distress alert and oriented x3 mood and affect normal, dressed appropriately.  HEENT: Pupils equal, extraocular movements intact  Respiratory: Patient's speak in full sentences and does not appear short of breath  Cardiovascular: No lower extremity edema, non tender, no erythema  Skin:Small erythematous rash noted with papule formation mostly on the axial skeleton and  going on the arms as well.  Abdomen: Soft nontender  Neuro: Cranial nerves II through XII are intact, neurovascularly intact in all extremities with 2+ DTRs and 2+ pulses.  Lymph: No lymphadenopathy of posterior or anterior cervical chain or axillae bilaterally.  Gait normal with good balance and coordination.  MSK:  Non tender with full range of motion and good stability and symmetric strength and tone of shoulders, elbows, wrist, hip, knee  and ankles bilaterally.  Back Exam:  Inspection: Unremarkable  Motion: Flexion 45 deg, Extension 45 deg, Side Bending to 45 deg bilaterally,  Rotation to 45 deg bilaterally  SLR laying: Negative  XSLR laying: Negative  Palpable tenderness: Tender over left SI joint  And worse then previous exam.  FABER: Positive Faber left. Sensory change: Gross sensation intact to all lumbar and sacral dermatomes.  Reflexes: 2+ at both patellar tendons, 2+ at achilles tendons, Babinski's downgoing.  Strength at foot  Plantar-flexion: 5/5 Dorsi-flexion: 5/5 Eversion: 5/5 Inversion: 5/5  Leg strength  Quad: 5/5 Hamstring: 5/5 Hip flexor: 5/5 Hip abductors: 4/5  Gait unremarkable.  Osteopathic findings Lumbar L5 flexed rotated and side bent left Sacrum Right on right Iliac Posterior right ilium       Impression and Recommendations:     This case required medical decision making of moderate complexity.

## 2014-10-19 NOTE — Assessment & Plan Note (Signed)
She responding to manipulation today. Am hoping that this will be beneficial. I do think labs are necessary as well patient having a rash. Uncle is in spondylitis is within the differential. In addition of this dermatitis up out of form is as well as gluten sensitivity could be possibly causing this. Labs ordered today. Patient given prednisone with her going out of town and would like to have this in case she feels worse. Patient will continue the topical anti-inflammatory some home exercises in the icing protocol. Patient come back again in 3 weeks for further evaluation and treatment.

## 2014-10-19 NOTE — Assessment & Plan Note (Signed)
Decision today to treat with OMT was based on Physical Exam  After verbal consent patient was treated with HVLA, ME techniques in lumbar, sacral and pelvis areas  Patient tolerated the procedure well with improvement in symptoms  Patient given exercises, stretches and lifestyle modifications  See medications in patient instructions if given  Patient will follow up in 3-4 weeks  

## 2014-10-19 NOTE — Patient Instructions (Addendum)
Good to see you.  Ice is your friend Prednisone if you need it! Have fun with grand baby Labs downstairs and will get back to you See me in 3 weeks.

## 2014-10-23 LAB — GLIADIN IGA+TTG IGA
ANTIGLIADIN ABS, IGA: 3 U (ref 0–19)
Transglutaminase IgA: <2 U/mL (ref 0–3)

## 2014-10-24 ENCOUNTER — Ambulatory Visit: Payer: PRIVATE HEALTH INSURANCE | Admitting: Family Medicine

## 2014-10-25 LAB — HLA B*5701: HLA-B*5701 w/rflx HLA-B High: NEGATIVE

## 2014-11-09 ENCOUNTER — Encounter: Payer: Self-pay | Admitting: Family Medicine

## 2014-11-09 ENCOUNTER — Ambulatory Visit (INDEPENDENT_AMBULATORY_CARE_PROVIDER_SITE_OTHER): Payer: PRIVATE HEALTH INSURANCE | Admitting: Family Medicine

## 2014-11-09 VITALS — BP 102/70 | HR 69 | Ht 65.0 in | Wt 154.0 lb

## 2014-11-09 DIAGNOSIS — G894 Chronic pain syndrome: Secondary | ICD-10-CM | POA: Diagnosis not present

## 2014-11-09 DIAGNOSIS — M9904 Segmental and somatic dysfunction of sacral region: Secondary | ICD-10-CM

## 2014-11-09 DIAGNOSIS — M533 Sacrococcygeal disorders, not elsewhere classified: Secondary | ICD-10-CM

## 2014-11-09 DIAGNOSIS — M9903 Segmental and somatic dysfunction of lumbar region: Secondary | ICD-10-CM | POA: Diagnosis not present

## 2014-11-09 DIAGNOSIS — M9905 Segmental and somatic dysfunction of pelvic region: Secondary | ICD-10-CM | POA: Diagnosis not present

## 2014-11-09 DIAGNOSIS — M999 Biomechanical lesion, unspecified: Secondary | ICD-10-CM

## 2014-11-09 MED ORDER — FLUCONAZOLE 150 MG PO TABS: 150.0000 mg | ORAL_TABLET | Freq: Every day | ORAL | Status: DC

## 2014-11-09 MED ORDER — IBUPROFEN-FAMOTIDINE 800-26.6 MG PO TABS: ORAL_TABLET | ORAL | Status: DC

## 2014-11-09 NOTE — Progress Notes (Signed)
Susan ScaleZach Stephens D.O. [facility] [address] [place], [place] [address] Phone: [phone] Subjective:    I'm seeing this patient by the request  of:  Susan Abideichard Letvak, MD   CC: left SI pain and piriformis follow-up  MVH:[id number]:Subjective Susan Stephens is a 54 y.o. female coming in with complaint of  Sciatic type pain. Patient has a past medical history significant for back pain dating back to last year. Patient did have a significant workup including an MRI of the lumbar spine as well as a myelogram. 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 Patient did have an epidural back in December 2015 and states since her intervention she had been doing much better on the right leg with no numbness.  Patient was seen by me and was diagnosed with more of a sacroiliac joint dysfunction. Patient was also found have somewhat of a piriformis syndrome. Patient states that she continued to have some discomfort and wanted to be near pain-free. We attempted a sacroiliac joint injection. Patient was doing much better but then had an exacerbation. In addition of the exacerbation or rash occurred. Patient did have workup including labs which were unremarkable. No signs of an autoimmune disease. There is a concern that this could be more allergy related and patient was put on Singulair recently. Patient has not notice any significant improvement with the single layer but patient was given a dose of prednisone. Patient states that was the best her back is felt in quite a while. Patient's is also her rash was significant a better. Patient and introduced more gluten into her diet in the rash seemed to worsen again. When patient was off the prednisone the pain started coming back as well. Still very uncomfortable overall.  No past medical history on file. Past Surgical History  Procedure Laterality Date  . Abdominal  hysterectomy     History   Social History  . Marital Status: Married    Spouse Name: N/A  . Number of Children: N/A  . Years of Education: N/A   Occupational History  . Not on file.   Social History Main Topics  . Smoking status: Never Smoker   . Smokeless tobacco: Never Used  . Alcohol Use: Yes     Comment: occasional  . Drug Use: No  . Sexual Activity: Not on file   Other Topics Concern  . Not on file   Social History Narrative   Allergies  Allergen Reactions  . Latex     Causes irritation  No family history on file.     Past medical history, social, surgical and family history all reviewed in electronic medical record.   Review of Systems: No headache, visual changes, nausea, vomiting, diarrhea, constipation, dizziness, abdominal pain, skin rash, fevers, chills, night sweats, weight loss, swollen lymph nodes, body aches, joint swelling, muscle aches, chest pain, shortness of breath, mood changes.   Objective Blood pressure 102/70, pulse 69, height 5\' 5"  (1.651 m), weight 154 lb (69.854 kg), SpO2 97 %.  General: No apparent distress alert and oriented x3 mood and affect normal, dressed appropriately.  HEENT: Pupils equal, extraocular movements intact  Respiratory: Patient's speak in full sentences and does not appear short of breath  Cardiovascular: No lower extremity edema, non tender, no erythema  Skin:Small erythematous rash noted with papule formation mostly on the axial skeleton and going on the arms as well.  Abdomen: Soft nontender  Neuro: Cranial nerves II through XII are intact, neurovascularly intact in all extremities with 2+ DTRs and 2+ pulses.  Lymph: No lymphadenopathy of posterior or anterior cervical chain or axillae bilaterally.  Gait normal with good balance and coordination.  MSK:  Non tender with full range of motion and good stability and symmetric strength and tone of shoulders, elbows, wrist, hip, knee and ankles bilaterally.  Back Exam:    Inspection: Unremarkable  Motion: Flexion 45 deg, Extension 45 deg, Side Bending to 45 deg bilaterally,  Rotation to 45 deg bilaterally  SLR laying: Negative  XSLR laying: Negative  Palpable tenderness: Tender over left SI joint  And worse then previous exam.  FABER: Positive Faber left still present Sensory change: Gross sensation intact to all lumbar and sacral dermatomes.  Reflexes: 2+ at both patellar tendons, 2+ at achilles tendons, Babinski's downgoing.  Strength at foot  Plantar-flexion: 5/5 Dorsi-flexion: 5/5 Eversion: 5/5 Inversion: 5/5  Leg strength  Quad: 5/5 Hamstring: 5/5 Hip flexor: 5/5 Hip abductors: 4/5  Gait unremarkable.  Osteopathic findings Lumbar L5 flexed rotated and side bent left Sacrum Right on right Iliac Posterior right ilium       Impression and Recommendations:     This case required medical decision making of moderate complexity.

## 2014-11-09 NOTE — Patient Instructions (Signed)
Good to see you Diflucan daily for 30 days Stop the singulair Go on your 30 days cleans Start a probiotic with lactobacillus as first ingredient.  Duexis up to 3 times daily as needed.  See me again after the cleans.

## 2014-11-09 NOTE — Assessment & Plan Note (Signed)
Is having more of a chronic pain leg syndrome. I do think that autoimmune is still the possibility even though patient's labs are unremarkable at this time. There is a possibility for yeast overgrowth and patient will be treated for this. Patient was given a new prescription for anti-inflammatory that seems to be safer. We discussed icing protocol and continuing home exercises. This is not stopping her from any activities but she is becoming very frustrated. Patient has had multiple workups by other providers over the course of time as well. No significant change in environment seems to make any drastic change. Patient will remain active. Patient will come back and see me again in 4 weeks for further evaluation and treatment.

## 2014-11-09 NOTE — Progress Notes (Signed)
Pre visit review using our clinic review tool, if applicable. No additional management support is needed unless otherwise documented below in the visit note. 

## 2014-11-09 NOTE — Assessment & Plan Note (Signed)
Continues to focus on hip abductor's as well as the icing protocol. Continue to focus on stretching.

## 2014-11-09 NOTE — Assessment & Plan Note (Signed)
Decision today to treat with OMT was based on Physical Exam  After verbal consent patient was treated with HVLA, ME techniques in lumbar, sacral and pelvis areas  Patient tolerated the procedure well with improvement in symptoms  Patient given exercises, stretches and lifestyle modifications  See medications in patient instructions if given  Patient will follow up in 4 weeks

## 2014-12-11 ENCOUNTER — Ambulatory Visit: Payer: PRIVATE HEALTH INSURANCE | Admitting: Family Medicine

## 2014-12-14 ENCOUNTER — Encounter: Payer: Self-pay | Admitting: Family Medicine

## 2014-12-14 ENCOUNTER — Ambulatory Visit (INDEPENDENT_AMBULATORY_CARE_PROVIDER_SITE_OTHER): Payer: PRIVATE HEALTH INSURANCE | Admitting: Family Medicine

## 2014-12-14 VITALS — BP 112/72 | HR 74 | Ht 65.0 in | Wt 154.0 lb

## 2014-12-14 DIAGNOSIS — M9904 Segmental and somatic dysfunction of sacral region: Secondary | ICD-10-CM

## 2014-12-14 DIAGNOSIS — L299 Pruritus, unspecified: Secondary | ICD-10-CM | POA: Diagnosis not present

## 2014-12-14 DIAGNOSIS — R21 Rash and other nonspecific skin eruption: Secondary | ICD-10-CM

## 2014-12-14 DIAGNOSIS — M999 Biomechanical lesion, unspecified: Secondary | ICD-10-CM

## 2014-12-14 DIAGNOSIS — M9903 Segmental and somatic dysfunction of lumbar region: Secondary | ICD-10-CM

## 2014-12-14 DIAGNOSIS — M533 Sacrococcygeal disorders, not elsewhere classified: Secondary | ICD-10-CM

## 2014-12-14 DIAGNOSIS — M9905 Segmental and somatic dysfunction of pelvic region: Secondary | ICD-10-CM

## 2014-12-14 DIAGNOSIS — Z91018 Allergy to other foods: Secondary | ICD-10-CM | POA: Diagnosis not present

## 2014-12-14 MED ORDER — GABAPENTIN 100 MG PO CAPS: 200.0000 mg | ORAL_CAPSULE | Freq: Every day | ORAL | Status: DC

## 2014-12-14 NOTE — Assessment & Plan Note (Signed)
Patient was referred to an allergist.

## 2014-12-14 NOTE — Patient Instructions (Addendum)
Sorry for whatever confusion We will try gabapentin  at night and can up to   Continue everything else We will get you with allergist See me agian in 4-5 weeks.

## 2014-12-14 NOTE — Assessment & Plan Note (Signed)
Decision today to treat with OMT was based on Physical Exam  After verbal consent patient was treated with HVLA, ME techniques in lumbar, sacral and pelvis areas  Patient tolerated the procedure well with improvement in symptoms  Patient given exercises, stretches and lifestyle modifications  See medications in patient instructions if given  Patient will follow up in 3-4 weeks  

## 2014-12-14 NOTE — Progress Notes (Signed)
[facility] [address] [place], [place] [address] Phone: [phone] Subjective:    I'm seeing this patient by the request  of:  Tillman Abide, MD   CC: left SI pain and piriformis follow-up  [id number] [facility] is a 54 y.o. female coming in with complaint of  Sciatic type pain. Patient has a past medical history significant for back pain dating back to last year. Patient did have a significant workup including an MRI of the lumbar spine as well as a myelogram. 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 Patient did have an epidural back in December 2015 and states since her intervention she had been doing much better on the right leg with no numbness.  Patient was seen by me and was diagnosed with more of a sacroiliac joint dysfunction. Patient was also found have somewhat of a piriformis syndrome. Patient states that she continued to have some discomfort and wanted to be near pain-free. We attempted a sacroiliac joint injection. Patient was doing much better but then had an exacerbation. In addition of the exacerbation or rash occurred. Patient did have workup including labs which were unremarkable. No signs of an autoimmune disease. There is a concern that this could be more allergy related and patient was put on Singulair recently. Patient did not notice much improvement with this but did take going out of her diet and was making some improvement. Patient states overall her back has improved overall. Patient continues to have the discomfort sometimes. Still seems to be food related she states. Continues to have a rasp intermittently as well.  No past medical history on file. Past Surgical History  Procedure Laterality Date  . Abdominal hysterectomy     Social History   Social History  . Marital Status: Married    Spouse Name: N/A  . Number of Children:  N/A  . Years of Education: N/A   Occupational History  . Not on file.   Social History Main Topics  . Smoking status: Never Smoker   . Smokeless tobacco: Never Used  . Alcohol Use: Yes     Comment: occasional  . Drug Use: No  . Sexual Activity: Not on file   Other Topics Concern  . Not on file   Social History Narrative   Allergies  Allergen Reactions  . Latex     Causes irritation  No family history on file.     Past medical history, social, surgical and family history all reviewed in electronic medical record.   Review of Systems: No headache, visual changes, nausea, vomiting, diarrhea, constipation, dizziness, abdominal pain, skin rash, fevers, chills, night sweats, weight loss, swollen lymph nodes, body aches, joint swelling, muscle aches, chest pain, shortness of breath, mood changes.   Objective Blood pressure 112/72, pulse 74, height 5\' 5"  (1.651 m), weight 154 lb (69.854 kg), SpO2 97 %.  General: No apparent distress alert and oriented x3 mood and affect normal, dressed appropriately.  HEENT: Pupils equal, extraocular movements intact  Respiratory: Patient's speak in full sentences and does not appear short of breath  Cardiovascular: No lower extremity edema, non tender, no erythema  Skin:Small erythematous rash noted with papule formation mostly on the axial skeleton and going on the arms as well.  Abdomen: Soft nontender  Neuro: Cranial nerves II through XII are intact, neurovascularly intact in all extremities with 2+ DTRs  and 2+ pulses.  Lymph: No lymphadenopathy of posterior or anterior cervical chain or axillae bilaterally.  Gait normal with good balance and coordination.  MSK:  Non tender with full range of motion and good stability and symmetric strength and tone of shoulders, elbows, wrist, hip, knee and ankles bilaterally.  Back Exam:  Inspection: Unremarkable  Motion: Flexion 45 deg, Extension 35 deg, Side Bending to 45 deg bilaterally,  Rotation to 45  deg bilaterally  SLR laying: Negative  XSLR laying: Negative  Palpable tenderness: Tender over left SI joint  and mild increase in tenderness over the paraspinal musculature of the lumbar spine FABER: Positive Faber left  Sensory change: Gross sensation intact to all lumbar and sacral dermatomes.  Reflexes: 2+ at both patellar tendons, 2+ at achilles tendons, Babinski's downgoing.  Strength at foot  Plantar-flexion: 5/5 Dorsi-flexion: 5/5 Eversion: 5/5 Inversion: 5/5  Leg strength  Quad: 5/5 Hamstring: 5/5 Hip flexor: 5/5 Hip abductors: 4/5  Gait unremarkable.  Osteopathic findings Lumbar L5 flexed rotated and side bent left Sacrum Right on right Iliac Posterior right ilium       Impression and Recommendations:     This case required medical decision making of moderate complexity.

## 2014-12-14 NOTE — Assessment & Plan Note (Addendum)
Patient continues to have difficulty. We discussed icing regimen and home exercises. We discussed continuing on the other medications in patient has responded well to the ibuprofen. Patient continues to have the related rash and I do feel an allergist could be beneficial. Patient will be sent for further evaluation. Patient will see me again in 3-4 weeks

## 2014-12-14 NOTE — Progress Notes (Signed)
Pre visit review using our clinic review tool, if applicable. No additional management support is needed unless otherwise documented below in the visit note. 

## 2015-01-11 ENCOUNTER — Ambulatory Visit (INDEPENDENT_AMBULATORY_CARE_PROVIDER_SITE_OTHER): Payer: PRIVATE HEALTH INSURANCE | Admitting: Family Medicine

## 2015-01-11 ENCOUNTER — Encounter: Payer: Self-pay | Admitting: Family Medicine

## 2015-01-11 VITALS — BP 110/70 | HR 72 | Ht 65.0 in | Wt 156.0 lb

## 2015-01-11 DIAGNOSIS — M9903 Segmental and somatic dysfunction of lumbar region: Secondary | ICD-10-CM | POA: Diagnosis not present

## 2015-01-11 DIAGNOSIS — M9905 Segmental and somatic dysfunction of pelvic region: Secondary | ICD-10-CM

## 2015-01-11 DIAGNOSIS — M533 Sacrococcygeal disorders, not elsewhere classified: Secondary | ICD-10-CM | POA: Diagnosis not present

## 2015-01-11 DIAGNOSIS — M9904 Segmental and somatic dysfunction of sacral region: Secondary | ICD-10-CM | POA: Diagnosis not present

## 2015-01-11 DIAGNOSIS — M999 Biomechanical lesion, unspecified: Secondary | ICD-10-CM

## 2015-01-11 MED ORDER — GABAPENTIN 300 MG PO CAPS: ORAL_CAPSULE | ORAL | Status: DC

## 2015-01-11 NOTE — Patient Instructions (Signed)
Good to see you You are doing great Ok to start walking Hope our son is ok Gabapentin  at night  Start a walk-run progression: - Initially start one minute walking than one minute running for 20 mins in the first week,   then 25 mins during the second week, then 30 mins afterwards.  Once you have reached 30 mins: - Run 2 mins, then walk 1 min. -Then run 3 mins, and walk 1 min. -Then run 4 mins, and walk 1 min. -Then run 5 mins, and walk 1 min. -Slowly build up weekly to running 30 mins nonstop.  If painful at any of the steps, back up one step. Lets try again in 4 weeks.

## 2015-01-11 NOTE — Assessment & Plan Note (Signed)
Decision today to treat with OMT was based on Physical Exam  After verbal consent patient was treated with HVLA, ME techniques in lumbar, sacral and pelvis areas  Patient tolerated the procedure well with improvement in symptoms  Patient given exercises, stretches and lifestyle modifications  See medications in patient instructions if given  Patient will follow up in 4 weeks          

## 2015-01-11 NOTE — Progress Notes (Signed)
[name] [address] [place], [place] [address] Phone: [phone] Subjective:    I'm seeing this patient by the request  of:  Tillman Abide, MD   CC: left SI pain and piriformis follow-up  Susan Stephens is a 54 y.o. female coming in with complaint of  Sciatic type pain. Patient has a past medical history significant for back pain dating back to last year. Patient did have a significant workup including an MRI of the lumbar spine as well as a myelogram. 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 Patient did have an epidural back in December 2015 and states since her intervention she had been doing much better . Patient has been doing well and continues to be active. Patient was to start walking. States that the gabapentin has stopped the significant pain that she was having in her foot. Patient states she is resting more comfortably as well. States that this is the most improvement she seen and is encouraged that she can start walking again in a warm regular routine. Patient has had trouble with a rash previously and will be seen in allergies in the near future.  No past medical history on file. Past Surgical History  Procedure Laterality Date  . Abdominal hysterectomy     Social History   Social History  . Marital Status: Married    Spouse Name: N/A  . Number of Children: N/A  . Years of Education: N/A   Occupational History  . Not on file.   Social History Main Topics  . Smoking status: Never Smoker   . Smokeless tobacco: Never Used  . Alcohol Use: Yes     Comment: occasional  . Drug Use: No  . Sexual Activity: Not on file   Other Topics Concern  . Not on file   Social History Narrative   Allergies  Allergen Reactions  . Latex     Causes irritation  No family history on file.     Past medical history, social, surgical  and family history all reviewed in electronic medical record.   Review of Systems: No headache, visual changes, nausea, vomiting, diarrhea, constipation, dizziness, abdominal pain, skin rash, fevers, chills, night sweats, weight loss, swollen lymph nodes, body aches, joint swelling, muscle aches, chest pain, shortness of breath, mood changes.   Objective Blood pressure 110/70, pulse 72, height  (1.651 m), weight 156 lb (70.761 kg), SpO2 94 %.  General: No apparent distress alert and oriented x3 mood and affect normal, dressed appropriately.  HEENT: Pupils equal, extraocular movements intact  Respiratory: Patient's speak in full sentences and does not appear short of breath  Cardiovascular: No lower extremity edema, non tender, no erythema  Skin:Small erythematous rash noted with papule formation mostly on the axial skeleton and going on the arms as well.  Abdomen: Soft nontender  Neuro: Cranial nerves II through XII are intact, neurovascularly intact in all extremities with 2+ DTRs and 2+ pulses.  Lymph: No lymphadenopathy of posterior or anterior cervical chain or axillae bilaterally.  Gait normal with good balance and coordination.  MSK:  Non tender with full range of motion and good stability and symmetric strength and tone of shoulders, elbows, wrist, hip, knee and ankles bilaterally.  Back Exam:  Inspection: Unremarkable  Motion: Flexion 45 deg, Extension 35 deg, Side Bending to 45 deg bilaterally,  Rotation to 45  deg bilaterally  SLR laying: Negative  XSLR laying: Negative  Palpable tenderness: Significantly less tender over the left SI joint from previous exam. FABER: Positive Faber left  Sensory change: Gross sensation intact to all lumbar and sacral dermatomes.  Reflexes: 2+ at both patellar tendons, 2+ at achilles tendons, Babinski's downgoing.  Strength at foot  Plantar-flexion: 5/5 Dorsi-flexion: 5/5 Eversion: 5/5 Inversion: 5/5  Leg strength  Quad: 5/5 Hamstring: 5/5  Hip flexor: 5/5 Hip abductors: 4+/5  Gait unremarkable.  Osteopathic findings Lumbar L2 flexed rotated and side bent right L5 flexed rotated and side bent left Sacrum Right on right Iliac Posterior right ilium   Impression and Recommendations:     This case required medical decision making of moderate complexity.

## 2015-01-11 NOTE — Assessment & Plan Note (Signed)
Patient is doing significantly better at this time. Patient still has some mild radicular symptoms that I think can be given her some difficulty. Patient though has responded well to the gabapentin and we'll increase this. Patient given a walking and running protocol to get her to start doing more activity. We discussed icing regimen. Patient will continue to do these changes and come back and see me again in 4 weeks for further evaluation and treatment.

## 2015-01-11 NOTE — Progress Notes (Signed)
Pre visit review using our clinic review tool, if applicable. No additional management support is needed unless otherwise documented below in the visit note. 

## 2015-02-01 ENCOUNTER — Ambulatory Visit (INDEPENDENT_AMBULATORY_CARE_PROVIDER_SITE_OTHER): Payer: PRIVATE HEALTH INSURANCE | Admitting: Family Medicine

## 2015-02-01 ENCOUNTER — Encounter: Payer: Self-pay | Admitting: Family Medicine

## 2015-02-01 VITALS — BP 122/72 | HR 76 | Ht 65.0 in | Wt 155.0 lb

## 2015-02-01 DIAGNOSIS — M533 Sacrococcygeal disorders, not elsewhere classified: Secondary | ICD-10-CM | POA: Diagnosis not present

## 2015-02-01 DIAGNOSIS — M9905 Segmental and somatic dysfunction of pelvic region: Secondary | ICD-10-CM | POA: Diagnosis not present

## 2015-02-01 DIAGNOSIS — M999 Biomechanical lesion, unspecified: Secondary | ICD-10-CM

## 2015-02-01 DIAGNOSIS — M9903 Segmental and somatic dysfunction of lumbar region: Secondary | ICD-10-CM

## 2015-02-01 DIAGNOSIS — M9904 Segmental and somatic dysfunction of sacral region: Secondary | ICD-10-CM

## 2015-02-01 NOTE — Progress Notes (Signed)
[name] [address] [place], [place] [address] Phone: [phone] Subjective:    I'm seeing this patient by the request  of:  Tillman Abide, MD   CC: left SI pain and piriformis follow-up  [facility] is a 54 y.o. female coming in with complaint of  Sciatic type pain. Patient has a past medical history significant for back pain dating back to last year. Patient did have a significant workup including an MRI of the lumbar spine as well as a myelogram. 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 Patient did have an epidural back in December 2015 and states since her intervention she had been doing much better .  Patient though is unfortunately having more of an exacerbation of the sacroiliac joint. Patient does not know exactly which she did but she has been moving a lot of boxes. Patient states that it seems to be more localized. Not radiating down the leg. Continues to see medications as previously. No other significant changes.  No past medical history on file. Past Surgical History  Procedure Laterality Date  . Abdominal hysterectomy     Social History   Social History  . Marital Status: Married    Spouse Name: N/A  . Number of Children: N/A  . Years of Education: N/A   Occupational History  . Not on file.   Social History Main Topics  . Smoking status: Never Smoker   . Smokeless tobacco: Never Used  . Alcohol Use: Yes     Comment: occasional  . Drug Use: No  . Sexual Activity: Not on file   Other Topics Concern  . Not on file   Social History Narrative   Allergies  Allergen Reactions  . Latex     Causes irritation  No family history on file.     Past medical history, social, surgical and family history all reviewed in electronic medical record.   Review of Systems: No headache, visual changes, nausea, vomiting,  diarrhea, constipation, dizziness, abdominal pain, skin rash, fevers, chills, night sweats, weight loss, swollen lymph nodes, body aches, joint swelling, muscle aches, chest pain, shortness of breath, mood changes.   Objective Blood pressure 122/72, pulse 76, height  (1.651 m), weight 155 lb (70.308 kg), SpO2 96 %.  General: No apparent distress alert and oriented x3 mood and affect normal, dressed appropriately.  HEENT: Pupils equal, extraocular movements intact  Respiratory: Patient's speak in full sentences and does not appear short of breath  Cardiovascular: No lower extremity edema, non tender, no erythema  Skin:Small erythematous rash noted with papule formation mostly on the axial skeleton and going on the arms as well.  Abdomen: Soft nontender  Neuro: Cranial nerves II through XII are intact, neurovascularly intact in all extremities with 2+ DTRs and 2+ pulses.  Lymph: No lymphadenopathy of posterior or anterior cervical chain or axillae bilaterally.  Gait normal with good balance and coordination.  MSK:  Non tender with full range of motion and good stability and symmetric strength and tone of shoulders, elbows, wrist, hip, knee and ankles bilaterally.  Back Exam:  Inspection: Unremarkable  Motion: Flexion 45 deg, Extension 35 deg, Side Bending to 45 deg bilaterally,  Rotation to 45 deg bilaterally  SLR laying: Negative  XSLR laying: Negative  Palpable tenderness: Significantly less tender over the left SI joint from previous exam. FABER: Positive  Faber left  Sensory change: Gross sensation intact to all lumbar and sacral dermatomes.  Reflexes: 2+ at both patellar tendons, 2+ at achilles tendons, Babinski's downgoing.  Strength at foot  Plantar-flexion: 5/5 Dorsi-flexion: 5/5 Eversion: 5/5 Inversion: 5/5  Leg strength  Quad: 5/5 Hamstring: 5/5 Hip flexor: 5/5 Hip abductors: 4+/5  Gait unremarkable.  Osteopathic findings Lumbar L2 flexed rotated and side bent right L5  flexed rotated and side bent left Sacrum Right on right Iliac Posterior right ilium   Impression and Recommendations:     This case required medical decision making of moderate complexity.

## 2015-02-01 NOTE — Assessment & Plan Note (Signed)
Patient did have an exacerbation. Patient will continue with the same medications that she is doing at this time. Patient will do the anti-inflammatory's that she has been doing but more on scheduled basis. We discussed adding Tylenol as needed. Patient will come back and see me again when she comes back in a town in 3 weeks.

## 2015-02-01 NOTE — Progress Notes (Signed)
Pre visit review using our clinic review tool, if applicable. No additional management support is needed unless otherwise documented below in the visit note. 

## 2015-02-01 NOTE — Assessment & Plan Note (Signed)
Decision today to treat with OMT was based on Physical Exam  After verbal consent patient was treated with HVLA, ME techniques in lumbar, sacral and pelvis areas  Patient tolerated the procedure well with improvement in symptoms  Patient given exercises, stretches and lifestyle modifications  See medications in patient instructions if given  Patient will follow up in 3-4 weeks  

## 2015-02-05 ENCOUNTER — Ambulatory Visit (INDEPENDENT_AMBULATORY_CARE_PROVIDER_SITE_OTHER): Payer: PRIVATE HEALTH INSURANCE | Admitting: Family Medicine

## 2015-02-05 ENCOUNTER — Other Ambulatory Visit (INDEPENDENT_AMBULATORY_CARE_PROVIDER_SITE_OTHER): Payer: PRIVATE HEALTH INSURANCE

## 2015-02-05 ENCOUNTER — Encounter: Payer: Self-pay | Admitting: Family Medicine

## 2015-02-05 VITALS — BP 118/78 | HR 68 | Ht 65.0 in | Wt 155.0 lb

## 2015-02-05 DIAGNOSIS — M9903 Segmental and somatic dysfunction of lumbar region: Secondary | ICD-10-CM

## 2015-02-05 DIAGNOSIS — M9904 Segmental and somatic dysfunction of sacral region: Secondary | ICD-10-CM

## 2015-02-05 DIAGNOSIS — M9905 Segmental and somatic dysfunction of pelvic region: Secondary | ICD-10-CM | POA: Diagnosis not present

## 2015-02-05 DIAGNOSIS — M533 Sacrococcygeal disorders, not elsewhere classified: Secondary | ICD-10-CM

## 2015-02-05 DIAGNOSIS — M999 Biomechanical lesion, unspecified: Secondary | ICD-10-CM

## 2015-02-05 MED ORDER — CYCLOBENZAPRINE HCL 10 MG PO TABS: 10.0000 mg | ORAL_TABLET | Freq: Three times a day (TID) | ORAL | Status: DC | PRN

## 2015-02-05 MED ORDER — PREDNISONE 50 MG PO TABS: 50.0000 mg | ORAL_TABLET | Freq: Every day | ORAL | Status: DC

## 2015-02-05 NOTE — Assessment & Plan Note (Signed)
Decision today to treat with OMT was based on Physical Exam  After verbal consent patient was treated with HVLA, ME techniques in lumbar, sacral and pelvis areas  Patient tolerated the procedure well with improvement in symptoms  Patient given exercises, stretches and lifestyle modifications  See medications in patient instructions if given  Patient will follow up in 3-4 weeks  

## 2015-02-05 NOTE — Progress Notes (Signed)
Pre visit review using our clinic review tool, if applicable. No additional management support is needed unless otherwise documented below in the visit note. 

## 2015-02-05 NOTE — Assessment & Plan Note (Signed)
Patient given injection today and tolerated the procedure well. Patient did have some mild resolution of pain. We discussed icing regimen, patient does not improve she will take a 5 day burst of prednisone and prescription was given. Patient will be out of town for the next 3 weeks. Muscle relaxer also prescribed. Patient has gabapentin which she will continue at night. Patient will return again in 3 weeks for further evaluation and treatment. If she continues to have difficulty I would consider facet injection with her known L5-S1 facet arthritis. Patient is concerned because she did have a bleed with an epidural previously.  Spent  25 minutes with patient face-to-face and had greater than 50% of counseling including as described above in assessment and plan.

## 2015-02-05 NOTE — Progress Notes (Signed)
[facility] [address] [place], [place] [address] Phone: [phone] Subjective:    I'm seeing this patient by the request  of:  Susan Abide, MD   CC: left SI pain and piriformis follow-up  Susan Stephens is a 54 y.o. female coming in with complaint of  Sciatic type pain. Patient has a past medical history significant for back pain dating back to last year. Patient did have a significant workup including an MRI of the lumbar spine as well as a myelogram. 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 Patient did have an epidural back in December 2015.  Patient though is unfortunately having more of an exacerbation of the sacroiliac joint. Patient was seen just 3 days ago and did have manipulation. Patient was better she states for half a day and then the pain seemed to increase again. Patient states that it seems to be radiating into the left buttocks area. States that this is more painful than his been a long time. States that the radicular symptoms she had initially has improved significantly but unfortunately now this chronic pain is making it worse for even her daily activities. Not responding to the over-the-counter medications and continues to gabapentin on a regular basis. No past medical history on file. Past Surgical History  Procedure Laterality Date  . Abdominal hysterectomy     Social History   Social History  . Marital Status: Married    Spouse Name: N/A  . Number of Children: N/A  . Years of Education: N/A   Occupational History  . Not on file.   Social History Main Topics  . Smoking status: Never Smoker   . Smokeless tobacco: Never Used  . Alcohol Use: Yes     Comment: occasional  . Drug Use: No  . Sexual Activity: Not on file   Other Topics Concern  . Not on file   Social History Narrative   Allergies  Allergen  Reactions  . Latex     Causes irritation  No family history on file.     Past medical history, social, surgical and family history all reviewed in electronic medical record.   Review of Systems: No headache, visual changes, nausea, vomiting, diarrhea, constipation, dizziness, abdominal pain, skin rash, fevers, chills, night sweats, weight loss, swollen lymph nodes, body aches, joint swelling, muscle aches, chest pain, shortness of breath, mood changes.   Objective Blood pressure 118/78, pulse 68, height  (1.651 m), weight 155 lb (70.308 kg), SpO2 99 %.  General: No apparent distress alert and oriented x3 mood and affect normal, dressed appropriately.  HEENT: Pupils equal, extraocular movements intact  Respiratory: Patient's speak in full sentences and does not appear short of breath  Cardiovascular: No lower extremity edema, non tender, no erythema  Skin:Small erythematous rash noted with papule formation mostly on the axial skeleton and going on the arms as well.  Abdomen: Soft nontender  Neuro: Cranial nerves II through XII are intact, neurovascularly intact in all extremities with 2+ DTRs and 2+ pulses.  Lymph: No lymphadenopathy of posterior or anterior cervical chain or axillae bilaterally.  Gait normal with good balance and coordination.  MSK:  Non tender with full range of motion and good stability and symmetric strength and tone of shoulders, elbows, wrist, hip, knee and ankles bilaterally.  Back Exam:  Inspection: Unremarkable  Motion: Flexion 45 deg,  Extension 35 deg, Side Bending to 45 deg bilaterally,  Rotation to 45 deg bilaterally  SLR laying: Positive straight leg test left side XSLR laying: Negative  Palpable tenderness: Severe tenderness over the left sacroiliac joint FABER: Positive Faber left  Sensory change: Gross sensation intact to all lumbar and sacral dermatomes.  Reflexes: 2+ at both patellar tendons, 2+ at achilles tendons, Babinski's downgoing.    Strength at foot  Plantar-flexion: 5/5 Dorsi-flexion: 5/5 Eversion: 5/5 Inversion: 5/5  Leg strength  Quad: 5/5 Hamstring: 5/5 Hip flexor: 5/5 Hip abductors: 4+/5  Gait unremarkable.  Osteopathic findings Lumbar L2 flexed rotated and side bent right L5 flexed rotated and side bent left Sacrum Right on right Iliac Neutral  Procedure: Real-time Ultrasound Guided Injection of left sacroiliac joint Device: GE Logiq E  Ultrasound guided injection is preferred based studies that show increased duration, increased effect, greater accuracy, decreased procedural pain, increased response rate, and decreased cost with ultrasound guided versus blind injection.  Verbal informed consent obtained.  Time-out conducted.  Noted no overlying erythema, induration, or other signs of local infection.  Skin prepped in a sterile fashion.  Local anesthesia: Topical Ethyl chloride.  With sterile technique and under real time ultrasound guidance: with a 22-gauge 3-1/2 inch needle patient was injected with 2 mL of 0.5% Marcaine and 1 mL of Kenalog 40 mg/dL into the left sacroiliac joint.  Completed without difficulty  Pain immediately resolved suggesting accurate placement of the medication.  Advised to call if fevers/chills, erythema, induration, drainage, or persistent bleeding.  Images permanently stored and available for review in the ultrasound unit.  Impression: Technically successful ultrasound guided injection.    Impression and Recommendations:     This case required medical decision making of moderate complexity.

## 2015-02-05 NOTE — Patient Instructions (Addendum)
Good to see you Continue what you are doing Have a good trip If worsening take the prednisone Take flexeril at night We can consider a facet injection when you return Otherwise see me when you get back.

## 2015-02-09 ENCOUNTER — Encounter: Payer: Self-pay | Admitting: Family Medicine

## 2015-03-02 ENCOUNTER — Encounter: Payer: Self-pay | Admitting: Family Medicine

## 2015-03-02 ENCOUNTER — Ambulatory Visit (INDEPENDENT_AMBULATORY_CARE_PROVIDER_SITE_OTHER): Payer: PRIVATE HEALTH INSURANCE | Admitting: Family Medicine

## 2015-03-02 VITALS — BP 130/76 | HR 85 | Ht 65.0 in | Wt 153.0 lb

## 2015-03-02 DIAGNOSIS — M533 Sacrococcygeal disorders, not elsewhere classified: Secondary | ICD-10-CM | POA: Diagnosis not present

## 2015-03-02 NOTE — Progress Notes (Signed)
Pre visit review using our clinic review tool, if applicable. No additional management support is needed unless otherwise documented below in the visit note. 

## 2015-03-02 NOTE — Patient Instructions (Signed)
Good to see you You made my weekend!!!! Ice when you need it Keep doing what you are doing.  See me again in 3 weeks.

## 2015-03-02 NOTE — Progress Notes (Signed)
Tawana ScaleZach Smith D.O. [facility] [address] [place], [place] [address] Phone: [phone]) [phone] Subjective:    I'm seeing this patient by the request  of:  Tillman Abideichard Letvak, MD   CC: left SI pain and piriformis follow-up  NFA:[id number]:Subjective Susan Stephens is a 54 y.o. female coming in with complaint of  Sciatic type pain. Patient has a past medical history significant for back pain dating back to last year. Patient did have a significant workup including an MRI of the lumbar spine as well as a myelogram. 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 Patient did have an epidural back in December 2015. Do not have significant improvement.  Patient continued to have more of a sacroiliac joint pain. Patient was given an injection at last follow-up. Patient was to do exercises and continue with conservative therapy. Patient has had some laboratory workup previously but not for autoimmune labs.  Patient states that she is feeling significantly better at this time. Patient states that maybe 2 weeks after the injection as well as the prednisone she was feeling much better. Patient did travel to [place] in with sleeping in a bus. Patient denies any radiation down the leg and denies any numbness. Patient is happy with the results.  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 No past medical history on file. Past Surgical History  Procedure Laterality Date  . Abdominal hysterectomy     Social History   Social History  . Marital Status: Married    Spouse Name: N/A  . Number of Children: N/A  . Years of Education: N/A   Occupational History  . Not on file.   Social History Main Topics  . Smoking status: Never Smoker   . Smokeless tobacco: Never Used  . Alcohol Use: Yes     Comment: occasional  . Drug Use: No  . Sexual Activity: Not on file     Other Topics Concern  . Not on file   Social History Narrative   Allergies  Allergen Reactions  . Latex     Causes irritation  No family history on file.     Past medical history, social, surgical and family history all reviewed in electronic medical record.   Review of Systems: No headache, visual changes, nausea, vomiting, diarrhea, constipation, dizziness, abdominal pain, skin rash, fevers, chills, night sweats, weight loss, swollen lymph nodes, body aches, joint swelling, muscle aches, chest pain, shortness of breath, mood changes.   Objective There were no vitals taken for this visit.  General: No apparent distress alert and oriented x3 mood and affect normal, dressed appropriately.  HEENT: Pupils equal, extraocular movements intact  Respiratory: Patient's speak in full sentences and does not appear short of breath  Cardiovascular: No lower extremity edema, non tender, no erythema  Skin:Small erythematous rash noted with papule formation mostly on the axial skeleton and going on the arms as well.  Abdomen: Soft nontender  Neuro: Cranial nerves II through XII are intact, neurovascularly intact in all extremities with 2+ DTRs and 2+ pulses.  Lymph: No lymphadenopathy of posterior or anterior cervical chain or axillae bilaterally.  Gait normal with good balance and coordination.  MSK:  Non tender with full range of motion and good stability and symmetric strength and tone of shoulders, elbows, wrist, hip, knee and ankles bilaterally.  Back Exam:  Inspection: Unremarkable  Motion: Flexion 45 deg, Extension 35 deg, Side Bending to 45 deg bilaterally,  Rotation to 45 deg bilaterally  SLR laying: Negative XSLR laying: Negative  Palpable tenderness: Minimal tenderness over the left sacroiliac joint FABER: Left still   No sensory change  Gross sensation intact to all lumbar and sacral dermatomes.  Reflexes: 2+ at both patellar tendons, 2+ at achilles tendons, Babinski's  downgoing.  Strength at foot  Plantar-flexion: 5/5 Dorsi-flexion: 5/5 Eversion: 5/5 Inversion: 5/5  Leg strength  Quad: 5/5 Hamstring: 5/5 Hip flexor: 5/5 Hip abductors: 4+/5  Gait unremarkable.     Impression and Recommendations:     This case required medical decision making of moderate complexity.

## 2015-03-02 NOTE — Assessment & Plan Note (Signed)
Patient is doing much better at this time. Encourage her to continue home exercises in the conservative therapy. Patient will come back and see me again in 3-6 weeks if necessary.

## 2015-03-27 ENCOUNTER — Ambulatory Visit: Payer: PRIVATE HEALTH INSURANCE | Admitting: Family Medicine

## 2015-04-09 ENCOUNTER — Other Ambulatory Visit: Payer: Self-pay | Admitting: *Deleted

## 2015-04-09 MED ORDER — IBUPROFEN-FAMOTIDINE 800-26.6 MG PO TABS: ORAL_TABLET | ORAL | Status: AC

## 2015-04-09 NOTE — Telephone Encounter (Signed)
Refill done.  

## 2015-04-13 ENCOUNTER — Encounter: Payer: Self-pay | Admitting: Family Medicine

## 2015-04-13 ENCOUNTER — Ambulatory Visit (INDEPENDENT_AMBULATORY_CARE_PROVIDER_SITE_OTHER): Payer: PRIVATE HEALTH INSURANCE | Admitting: Family Medicine

## 2015-04-13 VITALS — BP 118/80 | HR 76 | Ht 65.0 in | Wt 156.0 lb

## 2015-04-13 DIAGNOSIS — M9905 Segmental and somatic dysfunction of pelvic region: Secondary | ICD-10-CM | POA: Diagnosis not present

## 2015-04-13 DIAGNOSIS — M9903 Segmental and somatic dysfunction of lumbar region: Secondary | ICD-10-CM

## 2015-04-13 DIAGNOSIS — M999 Biomechanical lesion, unspecified: Secondary | ICD-10-CM

## 2015-04-13 DIAGNOSIS — M9904 Segmental and somatic dysfunction of sacral region: Secondary | ICD-10-CM

## 2015-04-13 DIAGNOSIS — M533 Sacrococcygeal disorders, not elsewhere classified: Secondary | ICD-10-CM | POA: Diagnosis not present

## 2015-04-13 NOTE — Progress Notes (Signed)
[facility] [address] [place], [place] [address] Phone: [phone] Subjective:      CC: left SI pain and piriformis follow-up  ION:[id number] Susan Stephens is a 54 y.o. female coming in with complaint of  Sciatic type pain. Patient has a past medical history significant for back pain dating back to last year. Patient did have a significant workup including an MRI of the lumbar spine as well as a myelogram. 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 Patient did have an epidural back in December 2015. Do not have significant improvement.  Patient continued to have more of a sacroiliac joint pain. Autoimmune labs were unremarkable. Patient continues to do relatively well. Has had some mild exacerbation over the left sacroiliac joint. Has been quite some time since she's had it done. Patient rates the severity of pain a 6 out of 10. Has had increasing stress as well. 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 No past medical history on file. Past Surgical History  Procedure Laterality Date  . Abdominal hysterectomy     Social History   Social History  . Marital Status: Married    Spouse Name: N/A  . Number of Children: N/A  . Years of Education: N/A   Occupational History  . Not on file.   Social History Main Topics  . Smoking status: Never Smoker   . Smokeless tobacco: Never Used  . Alcohol Use: Yes     Comment: occasional  . Drug Use: No  . Sexual Activity: Not on file   Other Topics Concern  . Not on file   Social History Narrative   Allergies  Allergen Reactions  . Latex     Causes irritation  No family history on file.     Past medical history, social, surgical and family history all reviewed in electronic  medical record.   Review of Systems: No headache, visual changes, nausea, vomiting, diarrhea, constipation, dizziness, abdominal pain, skin rash, fevers, chills, night sweats, weight loss, swollen lymph nodes, body aches, joint swelling, muscle aches, chest pain, shortness of breath, mood changes.   Objective Blood pressure 118/80, pulse 76, height  (1.651 m), weight 156 lb (70.761 kg), SpO2 97 %.  General: No apparent distress alert and oriented x3 mood and affect normal, dressed appropriately.  HEENT: Pupils equal, extraocular movements intact  Respiratory: Patient's speak in full sentences and does not appear short of breath  Cardiovascular: No lower extremity edema, non tender, no erythema  Skin:Small erythematous rash noted with papule formation mostly on the axial skeleton and going on the arms as well.  Abdomen: Soft nontender  Neuro: Cranial nerves II through XII are intact, neurovascularly intact in all extremities with 2+ DTRs and 2+ pulses.  Lymph: No lymphadenopathy of posterior or anterior cervical chain or axillae bilaterally.  Gait normal with good balance and coordination.  MSK:  Non tender with full range of motion and good stability and symmetric strength and tone of shoulders, elbows, wrist, hip, knee and ankles bilaterally.  Back Exam:  Inspection: Unremarkable  Motion: Flexion 45 deg, Extension 35 deg, Side Bending to 45 deg bilaterally,  Rotation to 45 deg bilaterally  SLR laying: Negative XSLR laying: Negative  Palpable tenderness: Moderate tenderness over the left  sacroiliac joint FABER: Left still +  No sensory change  Gross sensation intact to all lumbar and sacral dermatomes.  Reflexes: 2+ at both patellar tendons, 2+ at achilles tendons, Babinski's downgoing.  Strength at foot  Plantar-flexion: 5/5 Dorsi-flexion: 5/5 Eversion: 5/5 Inversion: 5/5  Leg strength  Quad: 5/5 Hamstring: 5/5 Hip flexor: 5/5 Hip abductors: 4+/5  Gait  unremarkable.  Osteopathic findings Thoracic T5 extended rotated and side bent right Lumbar L2 flexed rotated and side bent right L5 flexed rotated and side bent left Sacrum Right on right Iliac Neutral   Impression and Recommendations:     This case required medical decision making of moderate complexity.

## 2015-04-13 NOTE — Assessment & Plan Note (Signed)
Decision today to treat with OMT was based on Physical Exam  After verbal consent patient was treated with HVLA, ME techniques in lumbar, sacral and pelvis areas  Patient tolerated the procedure well with improvement in symptoms  Patient given exercises, stretches and lifestyle modifications  See medications in patient instructions if given  Patient will follow up in 3-4 weeks  

## 2015-04-13 NOTE — Assessment & Plan Note (Signed)
Encouraged hip abduction. We discussed icing. We discussed what activities to do an which was potentially avoid. We discussed increasing range of motion. Patient likely also has some stress that seems to be giving her some difficulty as well. As long as she continues to respond to the current therapy we will continue. Patient will return in 3 weeks for further evaluation

## 2015-04-13 NOTE — Patient Instructions (Signed)
Good to see you  Ice is your friend See me again in 3 weeks  

## 2015-04-13 NOTE — Progress Notes (Signed)
Pre visit review using our clinic review tool, if applicable. No additional management support is needed unless otherwise documented below in the visit note. 

## 2015-04-16 ENCOUNTER — Other Ambulatory Visit: Payer: Self-pay | Admitting: Family Medicine

## 2015-04-16 NOTE — Telephone Encounter (Signed)
Refill done.  

## 2015-05-09 ENCOUNTER — Ambulatory Visit (INDEPENDENT_AMBULATORY_CARE_PROVIDER_SITE_OTHER): Payer: PRIVATE HEALTH INSURANCE | Admitting: Family Medicine

## 2015-05-09 ENCOUNTER — Encounter: Payer: Self-pay | Admitting: Family Medicine

## 2015-05-09 VITALS — BP 118/70 | HR 74 | Ht 65.0 in | Wt 156.0 lb

## 2015-05-09 DIAGNOSIS — M9904 Segmental and somatic dysfunction of sacral region: Secondary | ICD-10-CM

## 2015-05-09 DIAGNOSIS — M999 Biomechanical lesion, unspecified: Secondary | ICD-10-CM

## 2015-05-09 DIAGNOSIS — M9905 Segmental and somatic dysfunction of pelvic region: Secondary | ICD-10-CM

## 2015-05-09 DIAGNOSIS — M533 Sacrococcygeal disorders, not elsewhere classified: Secondary | ICD-10-CM | POA: Diagnosis not present

## 2015-05-09 DIAGNOSIS — M9903 Segmental and somatic dysfunction of lumbar region: Secondary | ICD-10-CM

## 2015-05-09 DIAGNOSIS — M9902 Segmental and somatic dysfunction of thoracic region: Secondary | ICD-10-CM | POA: Diagnosis not present

## 2015-05-09 NOTE — Assessment & Plan Note (Signed)
Decision today to treat with OMT was based on Physical Exam  After verbal consent patient was treated with HVLA, ME techniques in lumbar, sacral and pelvis areas  Patient tolerated the procedure well with improvement in symptoms  Patient given exercises, stretches and lifestyle modifications  See medications in patient instructions if given  Patient will follow up in 3-4 weeks  

## 2015-05-09 NOTE — Patient Instructions (Signed)
Verbal instructions given

## 2015-05-09 NOTE — Progress Notes (Signed)
Tawana ScaleZach Smith D.O. [facility] [address] [place], [place] [address] Phone: [phone] Subjective:      CC: left SI pain and piriformis follow-up  MVH:[id number]:Subjective [name] is a 55 y.o. female coming in with complaint of  Sciatic type pain. Patient has a past medical history significant for back pain dating back to last year. Patient did have a significant workup including an MRI of the lumbar spine as well as a myelogram. Patient was found to have a stable grade 1 anteriecember 2015. Do not have significant improvement.or listhesis at L5-S1 as well as mild to moderate facet degenerative changes at L5-S1. Patient did have an epidural back over a year ago with no significant improvement.  Patient continued to have more of a sacroiliac joint pain. Autoimmune labs were unremarkable.  Patient's pain from time to time overall. Patient is having some mild discomfort from time to time. Patient is going to have her insurance change this soon and is concerned about the presence of visits. Patient is coming in to have this manipulated done and see if any other conservative therapy can be done to be helpful. Continues on the gabapentin intermittently. Takes anti-inflammatories when needed. Continues on the high-dose vitamin D would state that she seems to be stable overall.  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 No past medical history on file. please see patient problem was. Reviewed in patient's EMR. Past Surgical History  Procedure Laterality Date  . Abdominal hysterectomy     previous back surgery that is pertinent and stated above. Social History   Social History  . Marital Status: Married    Spouse Name: N/A  . Number of Children: N/A  . Years of Education: N/A   Occupational History  . Not on file.   Social History Main Topics  . Smoking status: Never Smoker   . Smokeless tobacco: Never Used  .  Alcohol Use: Yes     Comment: occasional  . Drug Use: No  . Sexual Activity: Not on file   Other Topics Concern  . Not on file   Social History Narrative   Allergies  Allergen Reactions  . Latex     Causes irritation  No family history on file.     Past medical history, social, surgical and family history all reviewed and no pertinent info pertaining to chief complaint. All other was reviewed using the EMR.    Review of Systems: No headache, visual changes, nausea, vomiting, diarrhea, constipation, dizziness, abdominal pain, skin rash, fevers, chills, night sweats, weight loss, swollen lymph nodes, body aches, joint swelling, muscle aches, chest pain, shortness of breath, mood changes.   Objective Blood pressure 118/70, pulse 74, height 5\' 5"  (1.651 m), weight 156 lb (70.761 kg), SpO2 95 %.  General: No apparent distress alert and oriented x3 mood and affect normal, dressed appropriately.  HEENT: Pupils equal, extraocular movements intact  Respiratory: Patient's speak in full sentences and does not appear short of breath  Cardiovascular: No lower extremity edema, non tender, no erythema  Skin:Small erythematous rash noted with papule formation mostly on the axial skeleton and going on the arms as well.  Abdomen: Soft nontender  Neuro: Cranial nerves II through XII are intact, neurovascularly intact in all extremities with 2+ DTRs and 2+ pulses.  Lymph: No lymphadenopathy of posterior or anterior cervical chain or axillae bilaterally.  Gait normal with good balance and coordination.  MSK:  Non  tender with full range of motion and good stability and symmetric strength and tone of shoulders, elbows, wrist, hip, knee and ankles bilaterally.  Back Exam:  Inspection: Unremarkable  Motion: Flexion 45 deg, Extension 35 deg mild increase in stiffness. Side Bending to 45 deg bilaterally,  Rotation to 45 deg bilaterally  SLR laying: Negative XSLR laying: Negative  Palpable tenderness:  Moderate tenderness over the left sacroiliac joint and minorly increased from previous exam FABER: Left still +  No sensory change  Gross sensation intact to all lumbar and sacral dermatomes.  Reflexes: 2+ at both patellar tendons, 2+ at achilles tendons, Babinski's downgoing.  Strength at foot  Plantar-flexion: 5/5 Dorsi-flexion: 5/5 Eversion: 5/5 Inversion: 5/5  Leg strength  Quad: 5/5 Hamstring: 5/5 Hip flexor: 5/5 Hip abductors: 4+/5  Gait unremarkable.  Osteopathic findings Thoracic T5 extended rotated and side bent right T9 extended  rotated and side bent left Lumbar L2 flexed rotated and side bent right L5 flexed rotated and side bent left Sacrum Right on right Iliac Neutral Mild pelvic shear noted   Impression and Recommendations:     This case required medical decision making of moderate complexity.

## 2015-05-09 NOTE — Assessment & Plan Note (Signed)
Patient overall continues to respond well to conservative therapy including manipulation. Encourage her to do the exercises on a regular basis. Patient is looking at the possibility of moving to [place] in the near future. Patient then will come back and see me again in 3 weeks for further evaluation and treatment. No change in medical management at this point.

## 2015-05-09 NOTE — Progress Notes (Signed)
Pre visit review using our clinic review tool, if applicable. No additional management support is needed unless otherwise documented below in the visit note. 

## 2015-06-04 ENCOUNTER — Ambulatory Visit (INDEPENDENT_AMBULATORY_CARE_PROVIDER_SITE_OTHER): Payer: PRIVATE HEALTH INSURANCE | Admitting: Family Medicine

## 2015-06-04 ENCOUNTER — Encounter: Payer: Self-pay | Admitting: Family Medicine

## 2015-06-04 VITALS — BP 98/66 | HR 76 | Ht 65.0 in | Wt 157.0 lb

## 2015-06-04 DIAGNOSIS — M533 Sacrococcygeal disorders, not elsewhere classified: Secondary | ICD-10-CM | POA: Diagnosis not present

## 2015-06-04 DIAGNOSIS — M9904 Segmental and somatic dysfunction of sacral region: Secondary | ICD-10-CM

## 2015-06-04 DIAGNOSIS — M999 Biomechanical lesion, unspecified: Secondary | ICD-10-CM

## 2015-06-04 DIAGNOSIS — M9905 Segmental and somatic dysfunction of pelvic region: Secondary | ICD-10-CM | POA: Diagnosis not present

## 2015-06-04 DIAGNOSIS — M9903 Segmental and somatic dysfunction of lumbar region: Secondary | ICD-10-CM

## 2015-06-04 DIAGNOSIS — M9902 Segmental and somatic dysfunction of thoracic region: Secondary | ICD-10-CM | POA: Diagnosis not present

## 2015-06-04 NOTE — Progress Notes (Signed)
[name] [address] [place], [place] [address] Phone: [phone] Subjective:      CC: left SI pain and piriformis follow-up  Susan Stephens is a 55 y.o. female coming in with complaint of  Sciatic type pain. Patient has a past medical history significant for back pain dating back to last year. Patient did have a significant workup including an MRI of the lumbar spine as well as a myelogram. Patient was found to have a stable grade 1 anteriecember 2015. Do not have significant improvement.or listhesis at L5-S1 as well as mild to moderate facet degenerative changes at L5-S1. Patient did have an epidural back over a year ago with no significant improvement.  Patient continued to have more of a sacroiliac joint pain. Autoimmune labs were unremarkable.  Patient has been responding fairly well to conservative therapy. Has flared from time to time. Continues to take the gabapentin somewhat regularly. Topical anti-inflammatories. Has noticed some improvement with the once weekly vitamin D supplementation. Patient does respond somewhat to osteopathic manipulation as well. Patient states overall she is been doing relatively well. 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 States though that she has been feeling better. Denies any numbness or tingling. Overall she has been doing well and working on a regular basis. Feels that the vitamin D supplementation has been helpful. Not taking the gabapentin on a regular basis.   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 No past medical history on file. please see patient problem was. Reviewed in patient's EMR. Past Surgical History  Procedure Laterality Date  . Abdominal hysterectomy     previous back surgery that is pertinent and stated above. Social History   Social History  . Marital  Status: Married    Spouse Name: N/A  . Number of Children: N/A  . Years of Education: N/A   Occupational History  . Not on file.   Social History Main Topics  . Smoking status: Never Smoker   . Smokeless tobacco: Never Used  . Alcohol Use: Yes     Comment: occasional  . Drug Use: No  . Sexual Activity: Not on file   Other Topics Concern  . Not on file   Social History Narrative   Allergies  Allergen Reactions  . Latex     Causes irritation  No family history on file.     Past medical history, social, surgical and family history all reviewed and no pertinent info pertaining to chief complaint. All other was reviewed using the EMR.    Review of Systems: No headache, visual changes, nausea, vomiting, diarrhea, constipation, dizziness, abdominal pain, skin rash, fevers, chills, night sweats, weight loss, swollen lymph nodes, body aches, joint swelling, muscle aches, chest pain, shortness of breath, mood changes.   Objective Blood pressure 98/66, pulse 76, height  (1.651 m), weight 157 lb (71.215 kg), SpO2 98 %.  General: No apparent distress alert and oriented x3 mood and affect normal, dressed appropriately.  HEENT: Pupils equal, extraocular movements intact  Respiratory: Patient's speak in full sentences and does not appear short of breath  Cardiovascular: No lower extremity edema, non tender, no erythema  Skin:Small erythematous rash noted with papule formation mostly on the axial skeleton and going on the arms as well.  Abdomen: Soft nontender  Neuro: Cranial nerves II through XII are intact, neurovascularly intact in  all extremities with 2+ DTRs and 2+ pulses.  Lymph: No lymphadenopathy of posterior or anterior cervical chain or axillae bilaterally.  Gait normal with good balance and coordination.  MSK:  Non tender with full range of motion and good stability and symmetric strength and tone of shoulders, elbows, wrist, hip, knee and ankles bilaterally.  Back Exam:    Inspection: Unremarkable  Motion: Flexion 45 deg, Extension 35 deg mild increase in stiffness. Side Bending to 45 deg bilaterally,  Rotation to 45 deg bilaterally  SLR laying: Negative XSLR laying: Negative  Palpable tenderness: Moderate tenderness over the left sacroiliac joint  FABER: Left still + the continues to improve  No sensory change  Gross sensation intact to all lumbar and sacral dermatomes.  Reflexes: 2+ at both patellar tendons, 2+ at achilles tendons, Babinski's downgoing.  Strength at foot  Plantar-flexion: 5/5 Dorsi-flexion: 5/5 Eversion: 5/5 Inversion: 5/5  Leg strength  Quad: 5/5 Hamstring: 5/5 Hip flexor: 5/5 Hip abductors: 4+/5  Gait unremarkable.  Osteopathic findings Thoracic T5 extended rotated and side bent right T8 extended  rotated and side bent left Lumbar L2 flexed rotated and side bent right L5 flexed rotated and side bent left Sacrum Right on right Iliac Neutral    Impression and Recommendations:     This case required medical decision making of moderate complexity.

## 2015-06-04 NOTE — Assessment & Plan Note (Signed)
Overall patient seems to be doing relatively well. At this point patient will be having some trouble with her insurance and will be coming back on a more of an as-needed basis. Encourage her to continue to work on home exercises, icing, continue with the hip abductor strengthening. Patient will come back and see me if she truly needs me.

## 2015-06-04 NOTE — Assessment & Plan Note (Signed)
Decision today to treat with OMT was based on Physical Exam  After verbal consent patient was treated with HVLA, ME techniques in lumbar, sacral and pelvis areas  Patient tolerated the procedure well with improvement in symptoms  Patient given exercises, stretches and lifestyle modifications  See medications in patient instructions if given  Patient will follow up in 3-4 weeks  

## 2015-06-04 NOTE — Progress Notes (Signed)
Pre visit review using our clinic review tool, if applicable. No additional management support is needed unless otherwise documented below in the visit note. 

## 2015-06-04 NOTE — Patient Instructions (Signed)
Good to see you  I want you to continue to find time to focus on yourself.  Focus on the posture, core strength and the hip abductors.  You know where I am and call me if you need me.

## 2015-08-01 ENCOUNTER — Encounter: Payer: Self-pay | Admitting: Family Medicine

## 2015-08-01 ENCOUNTER — Ambulatory Visit (INDEPENDENT_AMBULATORY_CARE_PROVIDER_SITE_OTHER): Payer: PRIVATE HEALTH INSURANCE | Admitting: Family Medicine

## 2015-08-01 VITALS — BP 106/70 | HR 64 | Wt 157.0 lb

## 2015-08-01 DIAGNOSIS — M9905 Segmental and somatic dysfunction of pelvic region: Secondary | ICD-10-CM | POA: Diagnosis not present

## 2015-08-01 DIAGNOSIS — M9903 Segmental and somatic dysfunction of lumbar region: Secondary | ICD-10-CM

## 2015-08-01 DIAGNOSIS — M9904 Segmental and somatic dysfunction of sacral region: Secondary | ICD-10-CM

## 2015-08-01 DIAGNOSIS — M999 Biomechanical lesion, unspecified: Secondary | ICD-10-CM

## 2015-08-01 DIAGNOSIS — M533 Sacrococcygeal disorders, not elsewhere classified: Secondary | ICD-10-CM

## 2015-08-01 MED ORDER — PREDNISONE 50 MG PO TABS: 50.0000 mg | ORAL_TABLET | Freq: Every day | ORAL | Status: DC

## 2015-08-01 MED ORDER — VITAMIN D (ERGOCALCIFEROL) 1.25 MG (50000 UNIT) PO CAPS: 50000.0000 [IU] | ORAL_CAPSULE | ORAL | Status: AC

## 2015-08-01 NOTE — Assessment & Plan Note (Signed)
Patient I do not think needs another injection at this time. Did respond well to the manipulation. 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 Patient will come back and see me again in 3-4 weeks for further evaluation and treatment.

## 2015-08-01 NOTE — Assessment & Plan Note (Signed)
Decision today to treat with OMT was based on Physical Exam  After verbal consent patient was treated with HVLA, ME techniques in lumbar, sacral and pelvis areas  Patient tolerated the procedure well with improvement in symptoms  Patient given exercises, stretches and lifestyle modifications  See medications in patient instructions if given  Patient will follow up in 3-4 weeks  

## 2015-08-01 NOTE — Patient Instructions (Signed)
Verbal traction was given 

## 2015-08-01 NOTE — Progress Notes (Signed)
Tawana ScaleZach Smith D[facility] [address] [place], [place] [address] Phone: [phone] Subjective:      CC: left SI pain and piriformis follow-up  HQI:[id number]:Subjective Susan Stephens is a 55 y.o. female coming in with complaint of  Sciatic type pain. Patient has a past medical history significant for back pain dating back to last year. Patient did have a significant workup including an MRI of the lumbar spine as well as a myelogram. Patient was found to have a stable grade 1 2015.  Patient continued to have more of a sacroiliac joint pain. Autoimmune labs were unremarkable.   Patient had been doing very well recently and has been working out on a more regular basis. Unfortunate patient feels that something is change. She knows this because when she starts itching it seems to make things worse. Patient has noticed that if the manipulation helps it usually helps the itching as well. Denies any radiation of the legs or any numbness or tingling. States that it just seems to be worsening an exacerbation of her underlying problems.   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 No past medical history on file. please see patient problem was. Reviewed in patient's EMR. Past Surgical History  Procedure Laterality Date  . Abdominal hysterectomy     previous back surgery that is pertinent and stated above. Social History   Social History  . Marital Status: Married    Spouse Name: N/A  . Number of Children: N/A  . Years of Education: N/A   Occupational History  . Not on file.   Social History Main Topics  . Smoking status: Never Smoker   . Smokeless tobacco: Never Used  . Alcohol Use: Yes     Comment: occasional  . Drug Use: No  . Sexual Activity: Not on file   Other Topics Concern  . Not on file   Social History Narrative   Allergies  Allergen Reactions  . Latex     Causes irritation  No family history on  file.     Past medical history, social, surgical and family history all reviewed and no pertinent info pertaining to chief complaint. All other was reviewed using the EMR.    Review of Systems: No headache, visual changes, nausea, vomiting, diarrhea, constipation, dizziness, abdominal pain, skin rash, fevers, chills, night sweats, weight loss, swollen lymph nodes, body aches, joint swelling, muscle aches, chest pain, shortness of breath, mood changes.   Objective Blood pressure 106/70, pulse 64, weight 157 lb (71.215 kg).  General: No apparent distress alert and oriented x3 mood and affect normal, dressed appropriately.  HEENT: Pupils equal, extraocular movements intact  Respiratory: Patient's speak in full sentences and does not appear short of breath  Cardiovascular: No lower extremity edema, non tender, no erythema  Skin:Small erythematous rash noted with papule formation mostly on the axial skeleton and going on the arms as well.  Abdomen: Soft nontender  Neuro: Cranial nerves II through XII are intact, neurovascularly intact in all extremities with 2+ DTRs and 2+ pulses.  Lymph: No lymphadenopathy of posterior or anterior cervical chain or axillae bilaterally.  Gait normal with good balance and coordination.  MSK:  Non tender with full range of motion and good stability and symmetric strength and tone of shoulders, elbows, wrist, hip, knee and ankles bilaterally.  Back Exam:  Inspection: Unremarkable  Motion: Flexion 45 deg, Extension 35 deg mild increase in stiffness. Side Bending  to 45 deg bilaterally,  Rotation to 45 deg bilaterally  SLR laying: Negative XSLR laying: Negative  Palpable tenderness: Moderate tenderness over the left sacroiliac joint minorly worse than previous exam FABER: Positive left  No sensory change  Gross sensation intact to all lumbar and sacral dermatomes.  Reflexes: 2+ at both patellar tendons, 2+ at achilles tendons, Babinski's downgoing.  Strength at  foot  Plantar-flexion: 5/5 Dorsi-flexion: 5/5 Eversion: 5/5 Inversion: 5/5  Leg strength  Quad: 5/5 Hamstring: 5/5 Hip flexor: 5/5 Hip abductors: 4+/5  Gait unremarkable.  Osteopathic findings Thoracic T3 extended rotated and side bent right T8 extended  rotated and side bent left Lumbar L2 flexed rotated and side bent right L4 flexed rotated and side bent left Sacrum Right on right Iliac Neutral    Impression and Recommendations:     This case required medical decision making of moderate complexity.

## 2016-01-24 ENCOUNTER — Other Ambulatory Visit: Payer: Self-pay | Admitting: Obstetrics and Gynecology

## 2016-01-24 DIAGNOSIS — Z1231 Encounter for screening mammogram for malignant neoplasm of breast: Secondary | ICD-10-CM

## 2016-01-29 ENCOUNTER — Ambulatory Visit
Admission: RE | Admit: 2016-01-29 | Discharge: 2016-01-29 | Disposition: A | Discharge status: Home / Self Care | Payer: PRIVATE HEALTH INSURANCE | Source: Ambulatory Visit | Attending: Obstetrics and Gynecology | Admitting: Obstetrics and Gynecology

## 2016-01-29 DIAGNOSIS — Z1231 Encounter for screening mammogram for malignant neoplasm of breast: Secondary | ICD-10-CM

## 2016-02-01 ENCOUNTER — Other Ambulatory Visit: Payer: Self-pay | Admitting: Obstetrics and Gynecology

## 2016-02-01 DIAGNOSIS — R928 Other abnormal and inconclusive findings on diagnostic imaging of breast: Secondary | ICD-10-CM

## 2016-02-03 ENCOUNTER — Encounter: Payer: Self-pay | Admitting: Family Medicine

## 2016-02-04 ENCOUNTER — Ambulatory Visit
Admission: RE | Admit: 2016-02-04 | Discharge: 2016-02-04 | Disposition: A | Discharge status: Home / Self Care | Payer: PRIVATE HEALTH INSURANCE | Source: Ambulatory Visit | Attending: Obstetrics and Gynecology | Admitting: Obstetrics and Gynecology

## 2016-02-04 DIAGNOSIS — R928 Other abnormal and inconclusive findings on diagnostic imaging of breast: Secondary | ICD-10-CM

## 2017-05-08 ENCOUNTER — Other Ambulatory Visit: Payer: Self-pay | Admitting: Obstetrics and Gynecology

## 2017-05-08 DIAGNOSIS — Z1231 Encounter for screening mammogram for malignant neoplasm of breast: Secondary | ICD-10-CM

## 2017-05-27 ENCOUNTER — Ambulatory Visit
Admission: RE | Admit: 2017-05-27 | Discharge: 2017-05-27 | Disposition: A | Discharge status: Home / Self Care | Payer: PRIVATE HEALTH INSURANCE | Source: Ambulatory Visit | Attending: Obstetrics and Gynecology | Admitting: Obstetrics and Gynecology

## 2017-05-27 DIAGNOSIS — Z1231 Encounter for screening mammogram for malignant neoplasm of breast: Secondary | ICD-10-CM

## 2017-09-27 NOTE — Progress Notes (Signed)
[name] [address] [place], [place] [address] Phone: [phone] Subjective:     CC: back pain   [name]  Susan Stephens is a 57 y.o. female coming in with complaint of hip pain. Pain is on the left side. She has pain at night that keeps her awake. She does feel it during the day but not as bad at night. Right foot does seem to be going numb especially when she drives.  Has been quite sometime since we have seen patient.  Has had sacroiliac dysfunction previously.  Also has had a lumbar radiculopathy with a spondylolisthesis.  Patient is having worsening numbness of the right foot but more pain in the left hip.  Points over the left sacroiliac joint.  Responded well to an injection in October 2016.  Has been taking ibuprofen and Tylenol more regularly.     No past medical history on file. Past Surgical History:  Procedure Laterality Date  . ABDOMINAL HYSTERECTOMY    . AUGMENTATION MAMMAPLASTY     Social History   Socioeconomic History  . Marital status: Married    Spouse name: Not on file  . Number of children: Not on file  . Years of education: Not on file  . Highest education level: Not on file  Occupational History  . Not on file  Social Needs  . Financial resource strain: Not on file  . Food insecurity:    Worry: Not on file    Inability: Not on file  . Transportation needs:    Medical: Not on file    Non-medical: Not on file  Tobacco Use  . Smoking status: Never Smoker  . Smokeless tobacco: Never Used  Substance and Sexual Activity  . Alcohol use: Yes    Comment: occasional  . Drug use: No  . Sexual activity: Not on file  Lifestyle  . Physical activity:    Days per week: Not on file    Minutes per session: Not on file  . Stress: Not on file  Relationships  . Social connections:    Talks on phone: Not on file    Gets together: Not on file    Attends religious service: Not on file    Active member of club or  organization: Not on file    Attends meetings of clubs or organizations: Not on file    Relationship status: Not on file  Other Topics Concern  . Not on file  Social History Narrative  . Not on file   Allergies  Allergen Reactions  . Latex     Causes irritation   Family History  Problem Relation Age of Onset  . Breast cancer Maternal Aunt      Past medical history, social, surgical and family history all reviewed in electronic medical record.  No pertanent information unless stated regarding to the chief complaint.   Review of Systems:Review of systems updated and as accurate as of 09/29/17  No headache, visual changes, nausea, vomiting, diarrhea, constipation, dizziness, abdominal pain, skin rash, fevers, chills, night sweats, weight loss, swollen lymph nodes, body aches, joint swelling, muscle aches, chest pain, shortness of breath, mood changes.   Objective  Blood pressure 110/80, pulse (!) 53, height  (1.651 m), weight 166 lb (75.3 kg), SpO2 98 %. Systems examined below as of 09/29/17   General: No apparent distress alert and oriented x3 mood and affect normal, dressed appropriately.  HEENT: Pupils equal, extraocular movements intact  Respiratory:  Patient's speak in full sentences and does not appear short of breath  Cardiovascular: No lower extremity edema, non tender, no erythema  Skin: Warm dry intact with no signs of infection or rash on extremities or on axial skeleton.  Abdomen: Soft nontender  Neuro: Cranial nerves II through XII are intact, neurovascularly intact in all extremities with 2+ DTRs and 2+ pulses.  Lymph: No lymphadenopathy of posterior or anterior cervical chain or axillae bilaterally.  Gait normal with good balance and coordination.  MSK:  Non tender with full range of motion and good stability and symmetric strength and tone of shoulders, elbows, wrist, hip, knee and ankles bilaterally.  Back Exam:  Inspection: Unremarkable  Motion: Flexion 45  deg, Extension 15 deg, Side Bending to 35 deg bilaterally,  Rotation to 35 deg bilaterally  SLR laying: Negative  XSLR laying: Negative  Palpable tenderness: Severe tenderness over the left sacroiliac joint. FABER: Positive left. Sensory change: Gross sensation intact to all lumbar and sacral dermatomes.  Reflexes: 2+ at both patellar tendons, 2+ at achilles tendons, Babinski's downgoing.  Strength at foot  Plantar-flexion: 5/5 Dorsi-flexion: 5/5 Eversion: 5/5 Inversion: 5/5  Leg strength  Quad: 5/5 Hamstring: 5/5 Hip flexor: 5/5 Hip abductors: 5/5  Gait unremarkable.  Osteopathic findings C2 flexed rotated and side bent right T3 extended rotated and side bent right inhaled third rib T9 extended rotated and side bent left L2 flexed rotated and side bent right Sacrum left on left  After verbal consent patient was prepped with alcohol swabs and with a 21-gauge 2 inch needle was injected with 0.5 cc of 0.5% Marcaine and 0.5 cc of Kenalog 40 mg/mL into the left SI joint.      Impression and Recommendations:     This case required medical decision making of moderate complexity.      Note: This dictation was prepared with Dragon dictation along with smaller phrase technology. Any transcriptional errors that result from this process are unintentional.

## 2017-09-29 ENCOUNTER — Ambulatory Visit (INDEPENDENT_AMBULATORY_CARE_PROVIDER_SITE_OTHER): Payer: PRIVATE HEALTH INSURANCE | Admitting: Family Medicine

## 2017-09-29 VITALS — BP 110/80 | HR 53 | Ht 65.0 in | Wt 166.0 lb

## 2017-09-29 DIAGNOSIS — M533 Sacrococcygeal disorders, not elsewhere classified: Secondary | ICD-10-CM | POA: Diagnosis not present

## 2017-09-29 DIAGNOSIS — M5416 Radiculopathy, lumbar region: Secondary | ICD-10-CM

## 2017-09-29 DIAGNOSIS — M999 Biomechanical lesion, unspecified: Secondary | ICD-10-CM

## 2017-09-29 MED ORDER — MELOXICAM 15 MG PO TABS: 15.0000 mg | ORAL_TABLET | Freq: Every day | ORAL | 0 refills | Status: DC

## 2017-09-29 NOTE — Assessment & Plan Note (Addendum)
Decision today to treat with OMT was based on Physical Exam  After verbal consent patient was treated with HVLA, ME, FPR techniques in  thoracic, lumbar and sacral, pelvis areas  Patient tolerated the procedure well with improvement in symptoms  Patient given exercises, stretches and lifestyle modifications  See medications in patient instructions if given  Patient will follow up in 4 weeks 

## 2017-09-29 NOTE — Patient Instructions (Addendum)
Good to see you  New xrays downstairs Injected SI joint today  Stay active Keep working on the core strength  Stay active You know where I am if you need me Maybe set up an appointment in 4 weeks in case

## 2017-09-29 NOTE — Assessment & Plan Note (Signed)
Patient has done relatively well with the home exercise and has been much from her worsening pain.  X-rays are greater than 55 years old and will get repeats.  Injection given today.  Tolerated also osteopathic manipulation.  Topical anti-inflammatories given minorly.  Follow-up again in 4 weeks

## 2017-11-02 ENCOUNTER — Other Ambulatory Visit: Payer: Self-pay | Admitting: Family Medicine

## 2017-11-02 NOTE — Telephone Encounter (Signed)
Refill done.  

## 2018-01-24 NOTE — Progress Notes (Signed)
Tawana ScaleZach Smith D.O. [facility] [address] [place], [place] [address] Phone: [phone] Subjective:    I'm seeing this patient by the request  of:    CC: Back pain  OZH:[id number]:Subjective  Susan Stephens is a 57 y.o. female coming in with complaint of back pain. The SI joint pain has improved with the injection last visit. She is now having numbness in the right foot. She also notes a knot on the ishical tuberosity. Does use mobic at night which helps her pain. Also has pain in the left hip joint when lying on that side. Has a hard time driving due to the pain in the right leg. Numbness over thigh and calf. Does note a hard time walking in middle of night to go to bathroom.    Pain seems to be on the right side.  States sitting for long amount of time.  Points to more the buttocks region than the back itself.  Also notes that since the left hip is severely tender.  Wakes her up at night.  Patient has been walking a significant amount.  States approximately 70 miles a week     No past medical history on file. Past Surgical History:  Procedure Laterality Date  . ABDOMINAL HYSTERECTOMY    . AUGMENTATION MAMMAPLASTY     Social History   Socioeconomic History  . Marital status: Married    Spouse name: Not on file  . Number of children: Not on file  . Years of education: Not on file  . Highest education level: Not on file  Occupational History  . Not on file  Social Needs  . Financial resource strain: Not on file  . Food insecurity:    Worry: Not on file    Inability: Not on file  . Transportation needs:    Medical: Not on file    Non-medical: Not on file  Tobacco Use  . Smoking status: Never Smoker  . Smokeless tobacco: Never Used  Substance and Sexual Activity  . Alcohol use: Yes    Comment: occasional  . Drug use: No  . Sexual activity: Not on file  Lifestyle  . Physical activity:    Days per week: Not on file    Minutes per session: Not on file  . Stress: Not  on file  Relationships  . Social connections:    Talks on phone: Not on file    Gets together: Not on file    Attends religious service: Not on file    Active member of club or organization: Not on file    Attends meetings of clubs or organizations: Not on file    Relationship status: Not on file  Other Topics Concern  . Not on file  Social History Narrative  . Not on file   Allergies  Allergen Reactions  . Latex     Causes irritation   Family History  Problem Relation Age of Onset  . Breast cancer Maternal Aunt     Current Outpatient Medications (Endocrine & Metabolic):  .  predniSONE (DELTASONE) 50 MG tablet, Take 1 tablet (50 mg total) by mouth daily.    Current Outpatient Medications (Analgesics):  .  acetaminophen (TYLENOL) 325 MG tablet, Take 650 mg by mouth every 6 (six) hours as needed for mild pain, moderate pain or headache. .  Ibuprofen-Famotidine 800-26.6 MG TABS, Take 1 tablet three times daily as needed. .  meloxicam (MOBIC) 15 MG tablet, TAKE 1 TABLET BY MOUTH EVERY  DAY   Current Outpatient Medications (Other):  Marland Kitchen  Diclofenac Sodium 2 % SOLN, Apply 1 pump twice daily. Marland Kitchen  gabapentin (NEURONTIN) 100 MG capsule, TAKE 2 CAPSULES (200 MG TOTAL) BY MOUTH AT BEDTIME. Marland Kitchen  gabapentin (NEURONTIN) 300 MG capsule, nightly .  Vitamin D, Ergocalciferol, (DRISDOL) 50000 UNITS CAPS capsule, Take by mouth. .  Vitamin D, Ergocalciferol, (DRISDOL) 50000 units CAPS capsule, Take 1 capsule (50,000 Units total) by mouth every 7 (seven) days.    Past medical history, social, surgical and family history all reviewed in electronic medical record.  No pertanent information unless stated regarding to the chief complaint.   Review of Systems:  No headache, visual changes, nausea, vomiting, diarrhea, constipation, dizziness, abdominal pain, skin rash, fevers, chills, night sweats, weight loss, swollen lymph nodes, body aches, joint swelling,  chest pain, shortness of breath, mood  changes.  Positive muscle aches  Objective  Blood pressure 106/78, pulse 66, height 5\' 5"  (1.651 m), weight 151 lb (68.5 kg), SpO2 98 %.    General: No apparent distress alert and oriented x3 mood and affect normal, dressed appropriately.  HEENT: Pupils equal, extraocular movements intact  Respiratory: Patient's speak in full sentences and does not appear short of breath  Cardiovascular: No lower extremity edema, non tender, no erythema  Skin: Warm dry intact with no signs of infection or rash on extremities or on axial skeleton.  Abdomen: Soft nontender  Neuro: Cranial nerves II through XII are intact, neurovascularly intact in all extremities with 2+ DTRs and 2+ pulses.  Lymph: No lymphadenopathy of posterior or anterior cervical chain or axillae bilaterally.  Gait normal with good balance and coordination.  MSK:  Non tender with full range of motion and good stability and symmetric strength and tone of shoulders, elbows, wrist, hip, knee and ankles bilaterally.  Back Exam:  Inspection: Unremarkable  Motion: Flexion 45 deg, Extension 25 deg, Side Bending to 45 deg bilaterally,  Rotation to 45 deg bilaterally  SLR laying: Negative  XSLR laying: Negative  Palpable tenderness: Tender to palpation of the paraspinal musculature lumbar spine right greater than left.  Also severe tenderness over the left greater trochanteric area. FABER: Positive Faber bilaterally right greater than left.  Severe tenderness over the right piriformis. Sensory change: Gross sensation intact to all lumbar and sacral dermatomes.  Reflexes: 2+ at both patellar tendons, 2+ at achilles tendons, Babinski's downgoing.  Strength at foot  Plantar-flexion: 5/5 Dorsi-flexion: 5/5 Eversion: 5/5 Inversion: 5/5  Leg strength  Quad: 5/5 Hamstring: 5/5 Hip flexor: 5/5 Hip abductors: 5/5  Gait unremarkable.   Procedure: Real-time Ultrasound Guided Injection of left  greater trochanteric bursitis secondary to patient's body  habitus Device: GE Logiq Q7  Ultrasound guided injection is preferred based studies that show increased duration, increased effect, greater accuracy, decreased procedural pain, increased response rate, and decreased cost with ultrasound guided versus blind injection.  Verbal informed consent obtained.  Time-out conducted.  Noted no overlying erythema, induration, or other signs of local infection.  Skin prepped in a sterile fashion.  Local anesthesia: Topical Ethyl chloride.  With sterile technique and under real time ultrasound guidance:  Greater trochanteric area was visualized and patient's bursa was noted. A 22-gauge 3 inch needle was inserted and 4 cc of 0.5% Marcaine and 1 cc of Kenalog 40 mg/dL was injected. Pictures taken Completed without difficulty  Pain immediately resolved suggesting accurate placement of the medication.  Advised to call if fevers/chills, erythema, induration, drainage, or persistent bleeding.  Images  permanently stored and available for review in the ultrasound unit.  Impression: Technically successful ultrasound guided injection.  Procedure: Real-time Ultrasound Guided Injection of right piriformis tendon sheath Device: GE Logiq Q7 Ultrasound guided injection is preferred based studies that show increased duration, increased effect, greater accuracy, decreased procedural pain, increased response rate, and decreased cost with ultrasound guided versus blind injection.  Verbal informed consent obtained.  Time-out conducted.  Noted no overlying erythema, induration, or other signs of local infection.  Skin prepped in a sterile fashion.  Local anesthesia: Topical Ethyl chloride.  With sterile technique and under real time ultrasound guidance: 1 gauge 2 inch needle patient was injected with a total of 1 cc of 0.5% Marcaine and 1 cc of Kenalog 40 mg/mL. Completed without difficulty  Pain immediately resolved suggesting accurate placement of the medication.  Advised  to call if fevers/chills, erythema, induration, drainage, or persistent bleeding.  Images permanently stored and available for review in the ultrasound unit.  Impression: Technically successful ultrasound guided injection.   Impression and Recommendations:     This case required medical decision making of moderate complexity. The above documentation has been reviewed and is accurate and complete Judi Saa, DO       Note: This dictation was prepared with Dragon dictation along with smaller phrase technology. Any transcriptional errors that result from this process are unintentional.

## 2018-01-25 ENCOUNTER — Ambulatory Visit (INDEPENDENT_AMBULATORY_CARE_PROVIDER_SITE_OTHER): Payer: PRIVATE HEALTH INSURANCE | Admitting: Family Medicine

## 2018-01-25 ENCOUNTER — Ambulatory Visit: Payer: Self-pay

## 2018-01-25 ENCOUNTER — Encounter: Payer: Self-pay | Admitting: Family Medicine

## 2018-01-25 VITALS — BP 106/78 | HR 66 | Ht 65.0 in | Wt 151.0 lb

## 2018-01-25 DIAGNOSIS — M7062 Trochanteric bursitis, left hip: Secondary | ICD-10-CM

## 2018-01-25 DIAGNOSIS — M25551 Pain in right hip: Secondary | ICD-10-CM

## 2018-01-25 DIAGNOSIS — G5702 Lesion of sciatic nerve, left lower limb: Secondary | ICD-10-CM

## 2018-01-25 NOTE — Assessment & Plan Note (Signed)
Injected January 25, 2018.  Tolerated the procedure well.  No concerns no differential includes a adjacent segment disease.  We discussed icing regimen and home exercise.  May need to consider further work-up including x-rays and MRI if it continues.  Patient will continue the same medications.  No icing regimen.  Discussed manual massage.  Follow-up again in 4 to 6 weeks

## 2018-01-25 NOTE — Assessment & Plan Note (Signed)
Patient given injection today.  Patient given exercises to work on hip abductors.  Continue the once weekly vitamin D.  Discussed icing regimen.  Follow-up again in 4 to 6 weeks

## 2018-01-25 NOTE — Patient Instructions (Addendum)
Good to see you  Ice is your friend Tennis ball in back right pocket with a lot of sitting.  Piriformis tear and need hip abductor strengthening needed Wear the orthotics  Write me again in 6 weeks

## 2018-02-10 ENCOUNTER — Other Ambulatory Visit: Payer: Self-pay | Admitting: Family Medicine

## 2018-02-10 NOTE — Telephone Encounter (Signed)
Refill done.  

## 2018-08-15 ENCOUNTER — Encounter: Payer: Self-pay | Admitting: Family Medicine

## 2018-10-19 ENCOUNTER — Encounter: Payer: Self-pay | Admitting: Family Medicine

## 2019-05-12 ENCOUNTER — Telehealth: Payer: Self-pay

## 2019-05-12 NOTE — Telephone Encounter (Signed)
Patient called stated that she does not have a PCP that she only really sees Dr. Katrinka Blazing and wanted me to send a message to him that for three days have been having a pulsating headache last for about 3-4 hours and will take ibuprofen and will be better for a couple hours and go back to a pulsing headache about the size of an orange above right ear. Patient states head has been pulsing so long that her skull feels sore. Is wanting to know if she should be worrying since it is lasting so long. Patient was not sure if it was something an adjustment could help.

## 2019-05-13 NOTE — Telephone Encounter (Signed)
Sent patient MyChart message.

## 2019-10-21 ENCOUNTER — Telehealth: Payer: Self-pay | Admitting: Family Medicine

## 2019-10-21 NOTE — Telephone Encounter (Signed)
Patient called stating that her and her husband's SI joints are acting up and they would both like to be worked in next week if Dr Katrinka Blazing if possible.   Please advise.

## 2019-10-24 NOTE — Telephone Encounter (Signed)
Sent patient MyChart message regarding appointment.

## 2019-10-27 ENCOUNTER — Ambulatory Visit (INDEPENDENT_AMBULATORY_CARE_PROVIDER_SITE_OTHER): Payer: PRIVATE HEALTH INSURANCE | Admitting: Family Medicine

## 2019-10-27 ENCOUNTER — Encounter: Payer: Self-pay | Admitting: Family Medicine

## 2019-10-27 ENCOUNTER — Other Ambulatory Visit: Payer: Self-pay

## 2019-10-27 VITALS — BP 110/66 | HR 67 | Ht 65.0 in

## 2019-10-27 DIAGNOSIS — M2550 Pain in unspecified joint: Secondary | ICD-10-CM | POA: Diagnosis not present

## 2019-10-27 DIAGNOSIS — M999 Biomechanical lesion, unspecified: Secondary | ICD-10-CM | POA: Diagnosis not present

## 2019-10-27 DIAGNOSIS — M533 Sacrococcygeal disorders, not elsewhere classified: Secondary | ICD-10-CM | POA: Diagnosis not present

## 2019-10-27 LAB — CBC WITH DIFFERENTIAL/PLATELET
Basophils Absolute: 0 10*3/uL (ref 0.0–0.1)
Basophils Relative: 0.5 % (ref 0.0–3.0)
Eosinophils Absolute: 0.1 10*3/uL (ref 0.0–0.7)
Eosinophils Relative: 2.6 % (ref 0.0–5.0)
HCT: 42.6 % (ref 36.0–46.0)
Hemoglobin: 14.5 g/dL (ref 12.0–15.0)
Lymphocytes Relative: 33.8 % (ref 12.0–46.0)
Lymphs Abs: 1.9 10*3/uL (ref 0.7–4.0)
MCHC: 34.1 g/dL (ref 30.0–36.0)
MCV: 91.7 fl (ref 78.0–100.0)
Monocytes Absolute: 0.5 10*3/uL (ref 0.1–1.0)
Monocytes Relative: 9.5 % (ref 3.0–12.0)
Neutro Abs: 3 10*3/uL (ref 1.4–7.7)
Neutrophils Relative %: 53.6 % (ref 43.0–77.0)
Platelets: 313 10*3/uL (ref 150.0–400.0)
RBC: 4.65 Mil/uL (ref 3.87–5.11)
RDW: 12.7 % (ref 11.5–15.5)
WBC: 5.5 10*3/uL (ref 4.0–10.5)

## 2019-10-27 LAB — FERRITIN: Ferritin: 151.8 ng/mL (ref 10.0–291.0)

## 2019-10-27 LAB — COMPREHENSIVE METABOLIC PANEL
ALT: 24 U/L (ref 0–35)
AST: 23 U/L (ref 0–37)
Albumin: 4.8 g/dL (ref 3.5–5.2)
Alkaline Phosphatase: 69 U/L (ref 39–117)
BUN: 17 mg/dL (ref 6–23)
CO2: 27 mEq/L (ref 19–32)
Calcium: 9.7 mg/dL (ref 8.4–10.5)
Chloride: 103 mEq/L (ref 96–112)
Creatinine, Ser: 0.83 mg/dL (ref 0.40–1.20)
GFR: 70.83 mL/min (ref >60.00)
Glucose, Bld: 91 mg/dL (ref 70–99)
Potassium: 4.5 mEq/L (ref 3.5–5.1)
Sodium: 138 mEq/L (ref 135–145)
Total Bilirubin: 0.3 mg/dL (ref 0.2–1.2)
Total Protein: 7.3 g/dL (ref 6.0–8.3)

## 2019-10-27 LAB — IBC PANEL
Iron: 56 ug/dL (ref 42–145)
Saturation Ratios: 14.9 % — ABNORMAL LOW (ref 20.0–50.0)
Transferrin: 269 mg/dL (ref 212.0–360.0)

## 2019-10-27 LAB — TSH: TSH: 2.58 u[IU]/mL (ref 0.35–4.50)

## 2019-10-27 LAB — VITAMIN D 25 HYDROXY (VIT D DEFICIENCY, FRACTURES): VITD: 49.24 ng/mL (ref 30.00–100.00)

## 2019-10-27 NOTE — Patient Instructions (Addendum)
Labs today See me me when you need me

## 2019-10-27 NOTE — Progress Notes (Signed)
[name] [address] [place] [address] Phone: [phone] Subjective:   Susan Stephens, am serving as a scribe for Dr. Antoine Primas. This visit occurred during the SARS-CoV-2 public health emergency.  Safety protocols were in place, including screening questions prior to the visit, additional usage of staff PPE, and extensive cleaning of exam room while observing appropriate contact time as indicated for disinfecting solutions.   I'm seeing this patient by the request  of:  Karie Schwalbe, MD  CC: back and neck pain   [name]  Susan Stephens is a 59 y.o. female coming in with complaint of back and neck pain. Last seen on 01/25/2018 for right piriformis pain. Patient states that her left SI joint   Medications patient has been prescribed: Gabapentin intermittently as well as ibuprofen.  Taking: As above         Reviewed prior external information including notes and imaging from previsou exam, outside providers and external EMR if available.   As well as notes that were available from care everywhere and other healthcare systems.  Past medical history, social, surgical and family history all reviewed in electronic medical record.  No pertanent information unless stated regarding to the chief complaint.   No past medical history on file.  Allergies  Allergen Reactions  . Latex     Causes irritation     Review of Systems:  No headache, visual changes, nausea, vomiting, diarrhea, constipation, dizziness, abdominal pain, skin rash, fevers, chills, night sweats, weight loss, swollen lymph nodes, body aches, joint swelling, chest pain, shortness of breath, mood changes. POSITIVE muscle aches  Objective  Blood pressure 110/66, pulse 67, height 5\' 5"  (1.651 m), SpO2 97 %.   General: No apparent distress alert and oriented x3 mood and affect normal, dressed appropriately.  HEENT: Pupils equal, extraocular movements intact    Respiratory: Patient's speak in full sentences and does not appear short of breath  Cardiovascular: No lower extremity edema, non tender, no erythema  Neuro: Cranial nerves II through XII are intact, neurovascularly intact in all extremities with 2+ DTRs and 2+ pulses.  Gait normal with good balance and coordination.  MSK:  Non tender with full range of motion and good stability and symmetric strength and tone of shoulders, elbows, wrist, hip, knee and ankles bilaterally.  Back -back exam does have some tenderness to palpation over the sacroiliac joints left greater than right.  Mild positive on the left.  Tightness in the piriformis region as well.  No pain over the greater trochanteric area.  Patient's core strength is actually improved from previous exam  Osteopathic findings  C2 flexed rotated and side bent right T9 extended rotated and side bent left L1 flexed rotated and side bent right Sacrum left on left Pelvic shear right      Assessment and Plan:  SI (sacroiliac) joint dysfunction Chronic problem with mild exacerbation.  Otherwise consider the possibility of repeating injection but did not need to today.  Patient did get laboratory work-up.  Will refill medications as appropriate including the gabapentin if necessary.  Meloxicam for breakthrough.  Discussed potential muscle relaxer.  Vitamin D supplementation.  Follow-up again in 4 to 8 weeks    Nonallopathic problems  Decision today to treat with OMT was based on Physical Exam  After verbal consent patient was treated with HVLA, ME, FPR techniques in  thoracic, lumbar, and sacral  areas  Patient tolerated the procedure well with improvement  in symptoms  Patient given exercises, stretches and lifestyle modifications  See medications in patient instructions if given  Patient will follow up in 4-8 weeks      The above documentation has been reviewed and is accurate and complete Lyndal Pulley, DO        Note: This dictation was prepared with Dragon dictation along with smaller phrase technology. Any transcriptional errors that result from this process are unintentional.

## 2019-10-27 NOTE — Assessment & Plan Note (Signed)
Chronic problem with mild exacerbation.  Otherwise consider the possibility of repeating injection but did not need to today.  Patient did get laboratory work-up.  Will refill medications as appropriate including the gabapentin if necessary.  Meloxicam for breakthrough.  Discussed potential muscle relaxer.  Vitamin D supplementation.  Follow-up again in 4 to 8 weeks

## 2019-12-12 ENCOUNTER — Encounter: Payer: Self-pay | Admitting: Family Medicine

## 2019-12-12 ENCOUNTER — Other Ambulatory Visit: Payer: Self-pay | Admitting: Obstetrics and Gynecology

## 2019-12-12 DIAGNOSIS — Z1231 Encounter for screening mammogram for malignant neoplasm of breast: Secondary | ICD-10-CM

## 2019-12-14 ENCOUNTER — Ambulatory Visit
Admission: RE | Admit: 2019-12-14 | Discharge: 2019-12-14 | Disposition: A | Discharge status: Home / Self Care | Payer: PRIVATE HEALTH INSURANCE | Source: Ambulatory Visit | Attending: Obstetrics and Gynecology | Admitting: Obstetrics and Gynecology

## 2019-12-14 ENCOUNTER — Other Ambulatory Visit: Payer: Self-pay

## 2019-12-14 DIAGNOSIS — Z1231 Encounter for screening mammogram for malignant neoplasm of breast: Secondary | ICD-10-CM

## 2020-01-19 LAB — COLOGUARD: COLOGUARD: NEGATIVE

## 2020-01-19 LAB — EXTERNAL GENERIC LAB PROCEDURE: COLOGUARD: NEGATIVE

## 2020-10-12 ENCOUNTER — Encounter: Payer: Self-pay | Admitting: Family Medicine

## 2020-10-15 ENCOUNTER — Other Ambulatory Visit: Payer: Self-pay

## 2020-10-15 DIAGNOSIS — M79642 Pain in left hand: Secondary | ICD-10-CM

## 2020-10-15 DIAGNOSIS — M25521 Pain in right elbow: Secondary | ICD-10-CM

## 2020-10-15 DIAGNOSIS — M25522 Pain in left elbow: Secondary | ICD-10-CM

## 2020-10-15 DIAGNOSIS — M542 Cervicalgia: Secondary | ICD-10-CM

## 2020-10-16 NOTE — Progress Notes (Signed)
[facility] [address] [place] [address] Phone: [phone] Subjective:   I, [name] am serving as a Neurosurgeon for Dr. Antoine Primas. This visit occurred during the SARS-CoV-2 public health emergency.  Safety protocols were in place, including screening questions prior to the visit, additional usage of staff PPE, and extensive cleaning of exam room while observing appropriate contact time as indicated for disinfecting solutions.     I'm seeing this patient by the request  of:  Karie Schwalbe, MD  CC: Bilateral arm pain right greater than left  [id number]  Susan Stephens is a 60 y.o. female coming in with complaint of B arm pain. Suffered a FOOSH injury on 10/11/2020.  Patient fell when she was in a parking lot.  Had something that got wrapped around her legs and she fell.  Patient states right elbow is swollen and bruised. Left arm pain radiates to the thumb. Forearms are TTP. Has been icing. 7-8/10 at its worse. ADLs are effected due to pain. Sometimes numbness and tingling in the fingers.   Patient did have x-rays before we saw her.  X-rays of the right side show the patient has a very minimally displaced radial head fracture noted on the right side.  Significant angulation noted.  Left elbow appears to be unremarkable. Cervical x-rays show the patient does have moderate to severe degenerative disc disease at C5-C6.  Foraminal narrowing noted bilaterally as well at this level     No past medical history on file. Past Surgical History:  Procedure Laterality Date   ABDOMINAL HYSTERECTOMY     AUGMENTATION MAMMAPLASTY     Social History   Socioeconomic History   Marital status: Married    Spouse name: Not on file   Number of children: Not on file   Years of education: Not on file   Highest education level: Not on file  Occupational History   Not on file  Tobacco Use   Smoking status: Never   Smokeless tobacco: Never  Substance  and Sexual Activity   Alcohol use: Yes    Comment: occasional   Drug use: No   Sexual activity: Not on file  Other Topics Concern   Not on file  Social History Narrative   Not on file   Social Determinants of Health   Financial Resource Strain: Not on file  Food Insecurity: Not on file  Transportation Needs: Not on file  Physical Activity: Not on file  Stress: Not on file  Social Connections: Not on file   Allergies  Allergen Reactions   Latex     Causes irritation   Family History  Problem Relation Age of Onset   Breast cancer Maternal Aunt     Current Outpatient Medications (Endocrine & Metabolic):    predniSONE (DELTASONE) 50 MG tablet, Take 1 tablet (50 mg total) by mouth daily.    Current Outpatient Medications (Analgesics):    acetaminophen (TYLENOL) 325 MG tablet, Take 650 mg by mouth every 6 (six) hours as needed for mild pain, moderate pain or headache.   Ibuprofen-Famotidine 800-26.6 MG TABS, Take 1 tablet three times daily as needed.   meloxicam (MOBIC) 15 MG tablet, TAKE 1 TABLET BY MOUTH EVERY DAY   Current Outpatient Medications (Other):    Diclofenac Sodium 2 % SOLN, Apply 1 pump twice daily.   gabapentin (NEURONTIN) 100 MG capsule, TAKE 2 CAPSULES (200 MG TOTAL) BY MOUTH AT BEDTIME.   gabapentin (NEURONTIN) 300 MG capsule,  nightly   Vitamin D, Ergocalciferol, (DRISDOL) 50000 UNITS CAPS capsule, Take by mouth.   Vitamin D, Ergocalciferol, (DRISDOL) 50000 units CAPS capsule, Take 1 capsule (50,000 Units total) by mouth every 7 (seven) days.   Reviewed prior external information including notes and imaging from  primary care provider As well as notes that were available from care everywhere and other healthcare systems.  Past medical history, social, surgical and family history all reviewed in electronic medical record.  No pertanent information unless stated regarding to the chief complaint.   Review of Systems:  No headache, visual changes,  nausea, vomiting, diarrhea, constipation, dizziness, abdominal pain, skin rash, fevers, chills, night sweats, weight loss, swollen lymph nodes, body aches, joint swelling, chest pain, shortness of breath, mood changes. POSITIVE muscle aches  Objective  Blood pressure 100/78, pulse 65, height 5\' 5"  (1.651 m), weight 151 lb (68.5 kg), SpO2 97 %.   General: No apparent distress alert and oriented x3 mood and affect normal, dressed appropriately.  HEENT: Pupils equal, extraocular movements intact  Respiratory: Patient's speak in full sentences and does not appear short of breath  Cardiovascular: No lower extremity edema, non tender, no erythema  Gait normal with good balance and coordination.  MSK: Patient does have bruising of the elbows bilaterally right greater than left.  Patient does have limited range of motion of the right elbow lacking the last 5 degrees of extension, last 2 degrees of flexion and 1 last 5 degrees of supination.  Good grip strength noted.  Tender to palpation over the radial head itself.  Patient does have mild bruising on the left side of the elbow but full range of motion noted.  Nontender on exam.  Good strength bilaterally but patient does have pain with full extension against resistance on the right side.  Patient does have some bruising of the [facility] joint on the left side.  Very minimal pain but good range of motion.  No significant laxity noted of the [facility] joint. Neck exam shows fairly good range of motion overall.  Mild tightness of the paraspinal musculature.  Negative Spurling's noted today.  Limited musculoskeletal ultrasound was performed and interpreted by [facility]  Limited ultrasound of patient's right elbow shows the patient does have a cortical irregularity noted of the radial head.  No significant callus formation noted yet at this time.  Lateral epicondylar region appears to be unremarkable.  No hematoma noted in the radial head area which is good news.  Trace  effusion of the posterior aspect of the elbow joint noted. Impression: Radial head fracture consistent with x-ray   Impression and Recommendations:     The above documentation has been reviewed and is accurate and complete Judi Saa, DO

## 2020-10-17 ENCOUNTER — Encounter: Payer: Self-pay | Admitting: Family Medicine

## 2020-10-17 ENCOUNTER — Ambulatory Visit (INDEPENDENT_AMBULATORY_CARE_PROVIDER_SITE_OTHER): Payer: PRIVATE HEALTH INSURANCE

## 2020-10-17 ENCOUNTER — Other Ambulatory Visit: Payer: Self-pay

## 2020-10-17 ENCOUNTER — Ambulatory Visit (INDEPENDENT_AMBULATORY_CARE_PROVIDER_SITE_OTHER): Payer: PRIVATE HEALTH INSURANCE | Admitting: Family Medicine

## 2020-10-17 ENCOUNTER — Ambulatory Visit: Payer: Self-pay

## 2020-10-17 VITALS — BP 100/78 | HR 65 | Ht 65.0 in | Wt 151.0 lb

## 2020-10-17 DIAGNOSIS — M79642 Pain in left hand: Secondary | ICD-10-CM | POA: Diagnosis not present

## 2020-10-17 DIAGNOSIS — S52121A Displaced fracture of head of right radius, initial encounter for closed fracture: Secondary | ICD-10-CM

## 2020-10-17 DIAGNOSIS — M25521 Pain in right elbow: Secondary | ICD-10-CM

## 2020-10-17 DIAGNOSIS — M542 Cervicalgia: Secondary | ICD-10-CM

## 2020-10-17 DIAGNOSIS — M25522 Pain in left elbow: Secondary | ICD-10-CM

## 2020-10-17 NOTE — Patient Instructions (Addendum)
Good to see you Radial head fracture Arnica lotion Compression sleeve daily Ice 20 mins 3-4 times a day Exercise 3 times a week after the wedding See me again in 2-3 weeks repeat xrays ok to double book

## 2020-10-17 NOTE — Assessment & Plan Note (Signed)
Very minorly displaced.  Patient will start exercising next week.  Patient is 1 week out from this injury.  Does seem to have very mild displacement noted.  Patient can take anti-inflammatories, discussed compression, on topical anti-inflammatories.  Patient will follow up with me again in 2 to 3 weeks to make sure patient is healing appropriately.  Patient has good range of motion but lacks last 10 degrees of extension that we will need to continue to monitor as well as the last 5 degrees of supination.  Patient does have fairly good stability but patient did have involuntary involuntary guarding noted today.  Patient's wrist x-rays were fairly unremarkable but does have C5-C6 arthritic changes in the neck that we will continue to monitor but patient had good range of motion today with no true radicular symptoms.

## 2020-10-23 ENCOUNTER — Encounter: Payer: Self-pay | Admitting: Family Medicine

## 2020-10-23 DIAGNOSIS — M25521 Pain in right elbow: Secondary | ICD-10-CM

## 2020-11-06 NOTE — Progress Notes (Signed)
[name] [address] [place] [address] Phone: [phone] Subjective:   Susan Stephens, am serving as a scribe for Dr. Antoine Primas. This visit occurred during the SARS-CoV-2 public health emergency.  Safety protocols were in place, including screening questions prior to the visit, additional usage of staff PPE, and extensive cleaning of exam room while observing appropriate contact time as indicated for disinfecting solutions.   I'm seeing this patient by the request  of:  Karie Schwalbe, MD  CC: Elbow pain follow-up  [id number]  10/17/2020 Very minorly displaced.  Patient will start exercising next week.  Patient is 1 week out from this injury.  Does seem to have very mild displacement noted.  Patient can take anti-inflammatories, discussed compression, on topical anti-inflammatories.  Patient will follow up with me again in 2 to 3 weeks to make sure patient is healing appropriately.  Patient has good range of motion but lacks last 10 degrees of extension that we will need to continue to monitor as well as the last 5 degrees of supination.  Patient does have fairly good stability but patient did have involuntary involuntary guarding noted today.  Patient's wrist x-rays were fairly unremarkable but does have C5-C6 arthritic changes in the neck that we will continue to monitor but patient had good range of motion today with no true radicular symptoms.  Update 11/07/2020 Susan Stephens is a 60 y.o. female coming in with complaint of R elbow pain. Patient states that at rest her pain is manageable. Pain with movement. Has little strength in L arm.  Patient is making progress in the moment with the left arm and feels like she has full range of motion.  States that the right arm now continues to give her difficulty and if she tries to squeeze any thing it causes significant amount of pain.  Feels like the range of motion on the right side has plateaued.   Does feel that the bruising has improved.  Patient up to the last couple days was having her husband shampoo her hair though due to the decreased range of motion.  Other things such as dressing has still been difficult with the right upper extremity       No past medical history on file. Past Surgical History:  Procedure Laterality Date   ABDOMINAL HYSTERECTOMY     AUGMENTATION MAMMAPLASTY     Social History   Socioeconomic History   Marital status: Married    Spouse name: Not on file   Number of children: Not on file   Years of education: Not on file   Highest education level: Not on file  Occupational History   Not on file  Tobacco Use   Smoking status: Never   Smokeless tobacco: Never  Substance and Sexual Activity   Alcohol use: Yes    Comment: occasional   Drug use: No   Sexual activity: Not on file  Other Topics Concern   Not on file  Social History Narrative   Not on file   Social Determinants of Health   Financial Resource Strain: Not on file  Food Insecurity: Not on file  Transportation Needs: Not on file  Physical Activity: Not on file  Stress: Not on file  Social Connections: Not on file   Allergies  Allergen Reactions   Latex     Causes irritation   Family History  Problem Relation Age of Onset   Breast cancer Maternal Aunt  Current Outpatient Medications (Endocrine & Metabolic):    predniSONE (DELTASONE) 50 MG tablet, Take 1 tablet (50 mg total) by mouth daily.    Current Outpatient Medications (Analgesics):    acetaminophen (TYLENOL) 325 MG tablet, Take 650 mg by mouth every 6 (six) hours as needed for mild pain, moderate pain or headache.   Ibuprofen-Famotidine 800-26.6 MG TABS, Take 1 tablet three times daily as needed.   meloxicam (MOBIC) 15 MG tablet, TAKE 1 TABLET BY MOUTH EVERY DAY   Current Outpatient Medications (Other):    diazepam (VALIUM) 5 MG tablet, One tab by mouth, 2 hours before procedure.   Diclofenac Sodium 2 %  SOLN, Apply 1 pump twice daily.   gabapentin (NEURONTIN) 100 MG capsule, TAKE 2 CAPSULES (200 MG TOTAL) BY MOUTH AT BEDTIME.   gabapentin (NEURONTIN) 300 MG capsule, nightly   Vitamin D, Ergocalciferol, (DRISDOL) 50000 UNITS CAPS capsule, Take by mouth.   Vitamin D, Ergocalciferol, (DRISDOL) 50000 units CAPS capsule, Take 1 capsule (50,000 Units total) by mouth every 7 (seven) days.   Reviewed prior external information including notes and imaging from  primary care provider As well as notes that were available from care everywhere and other healthcare systems.  Past medical history, social, surgical and family history all reviewed in electronic medical record.  No pertanent information unless stated regarding to the chief complaint.   Review of Systems:  No headache, visual changes, nausea, vomiting, diarrhea, constipation, dizziness, abdominal pain, skin rash, fevers, chills, night sweats, weight loss, swollen lymph nodes, body aches, joint swelling, chest pain, shortness of breath, mood changes. POSITIVE muscle aches  Objective  Blood pressure 110/78, pulse 61, height 5\' 5"  (1.651 m), weight 152 lb (68.9 kg), SpO2 98 %.   General: No apparent distress alert and oriented x3 mood and affect normal, dressed appropriately.  HEENT: Pupils equal, extraocular movements intact  Respiratory: Patient's speak in full sentences and does not appear short of breath  Cardiovascular: No lower extremity edema, non tender, no erythema  Gait normal with good balance and coordination.  MSK: Right elbow exam does have decreased bruising and swelling from previous exam but still lacks the last 10 degrees of extension of the elbow in the last 10 degrees of flexion.  Patient also lacks 10 to 15 degrees of supination secondary to pain.  Neurovascularly intact distally.  No pain in the shoulder.  No pain in the anatomical snuffbox the patient does have discomfort over the [facility] joints bilaterally. Left elbow has  mild pain over the radial head but full range of motion in all planes which is an improvement    Impression and Recommendations:     The above documentation has been reviewed and is accurate and complete [facility], DO

## 2020-11-07 ENCOUNTER — Ambulatory Visit (INDEPENDENT_AMBULATORY_CARE_PROVIDER_SITE_OTHER): Payer: PRIVATE HEALTH INSURANCE | Admitting: Family Medicine

## 2020-11-07 ENCOUNTER — Ambulatory Visit (INDEPENDENT_AMBULATORY_CARE_PROVIDER_SITE_OTHER): Payer: PRIVATE HEALTH INSURANCE

## 2020-11-07 ENCOUNTER — Other Ambulatory Visit: Payer: Self-pay

## 2020-11-07 ENCOUNTER — Ambulatory Visit: Payer: Self-pay

## 2020-11-07 VITALS — BP 110/78 | HR 61 | Ht 65.0 in | Wt 152.0 lb

## 2020-11-07 DIAGNOSIS — M25521 Pain in right elbow: Secondary | ICD-10-CM

## 2020-11-07 DIAGNOSIS — S52121G Displaced fracture of head of right radius, subsequent encounter for closed fracture with delayed healing: Secondary | ICD-10-CM | POA: Diagnosis not present

## 2020-11-07 MED ORDER — DIAZEPAM 5 MG PO TABS: ORAL_TABLET | ORAL | 0 refills | Status: DC

## 2020-11-07 NOTE — Assessment & Plan Note (Signed)
Patient does have a radial head fracture.  Seems to be doing really the right side with mild displacement noted.  Continues to have some limited range of motion.  Patient has made some improvement.  Unfortunately continues to have knee pain with some daily activities and at this time.  Patient could be potential candidate for surgical intervention but likely will do well I do believe though that the MRI will help [place] know when we can start with more aggressive formal physical therapy.  Patient is in agreement with this and will be referred for the MRI and then discuss further.

## 2020-11-07 NOTE — Patient Instructions (Addendum)
[facility] [phone] See me again in 5-6 weeks

## 2020-11-08 ENCOUNTER — Encounter: Payer: Self-pay | Admitting: Family Medicine

## 2020-12-04 ENCOUNTER — Other Ambulatory Visit: Payer: Self-pay

## 2020-12-04 ENCOUNTER — Encounter: Payer: Self-pay | Admitting: Family Medicine

## 2020-12-04 DIAGNOSIS — S52121G Displaced fracture of head of right radius, subsequent encounter for closed fracture with delayed healing: Secondary | ICD-10-CM

## 2020-12-12 ENCOUNTER — Ambulatory Visit: Payer: PRIVATE HEALTH INSURANCE | Admitting: Family Medicine

## 2021-02-07 ENCOUNTER — Encounter: Payer: Self-pay | Admitting: Family Medicine

## 2021-10-29 IMAGING — DX DG CERVICAL SPINE COMPLETE 4+V
6 series · 6 of 6 positions shown · non-contrast
Comparison: None

CLINICAL DATA: Cervical spine pain, fell in parking lot, pain
since, radial fracture per patient

EXAM:
CERVICAL SPINE - COMPLETE 4+ VIEW

[c-spine lat]
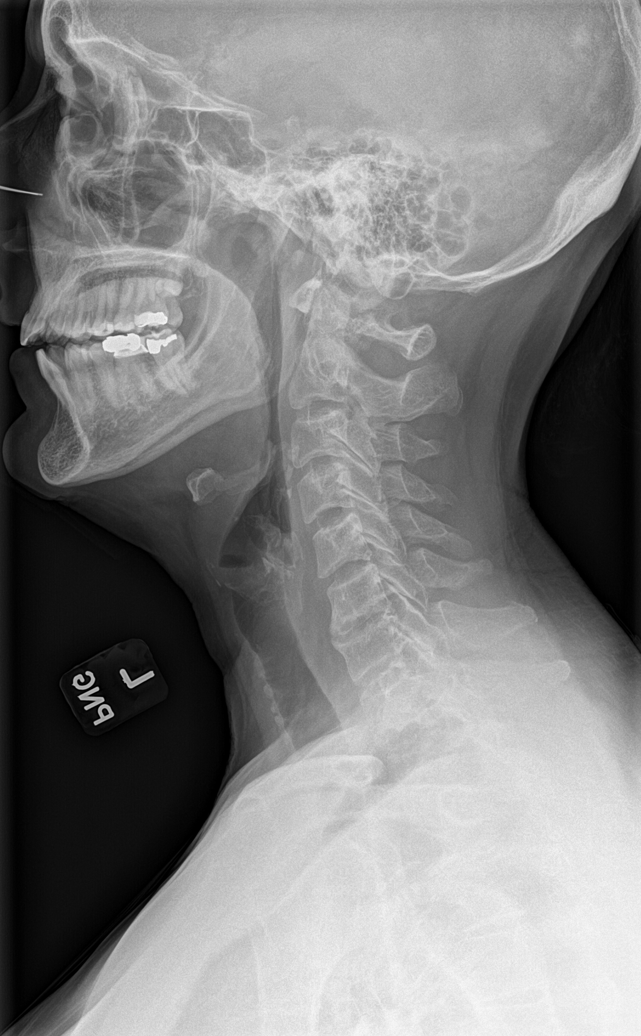

[c-spine obl (1 of 2)]
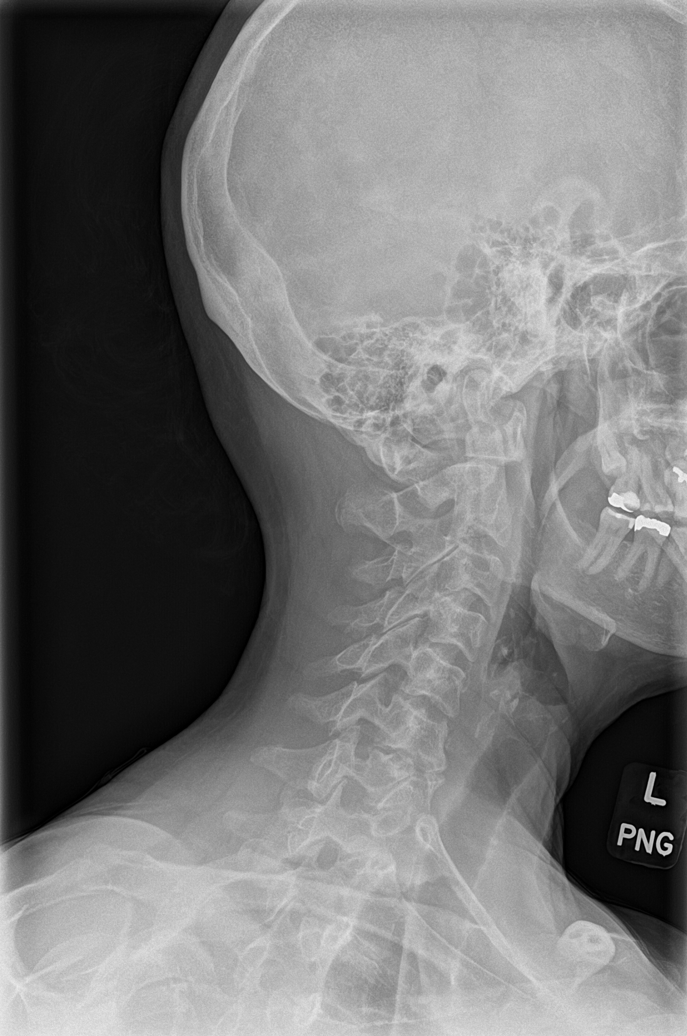

[c-spine obl (2 of 2)]
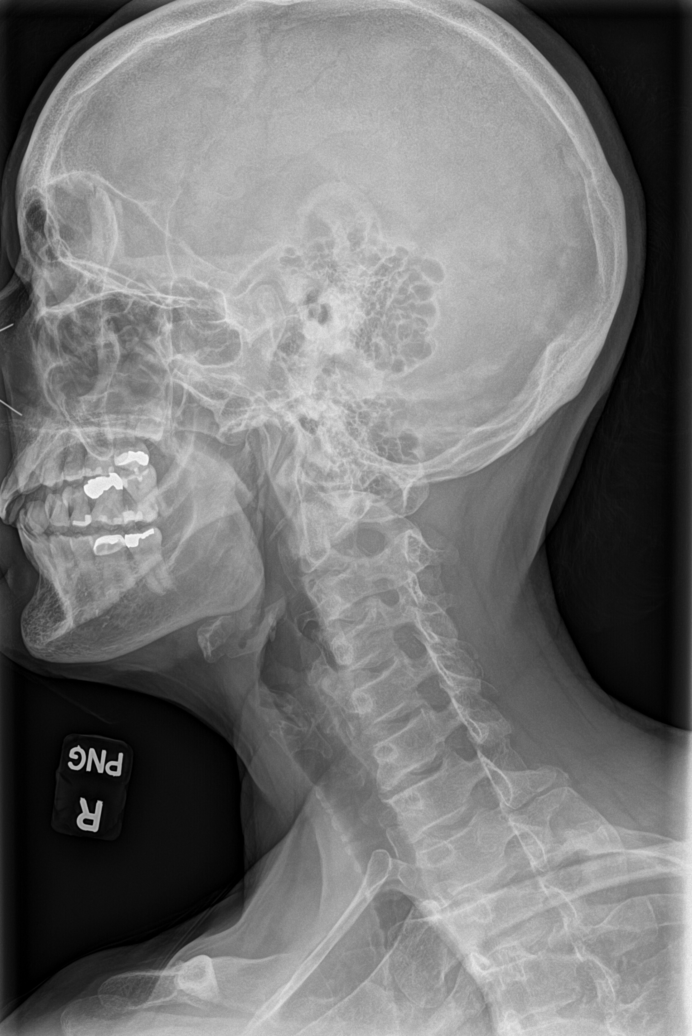

[c-spine ap]
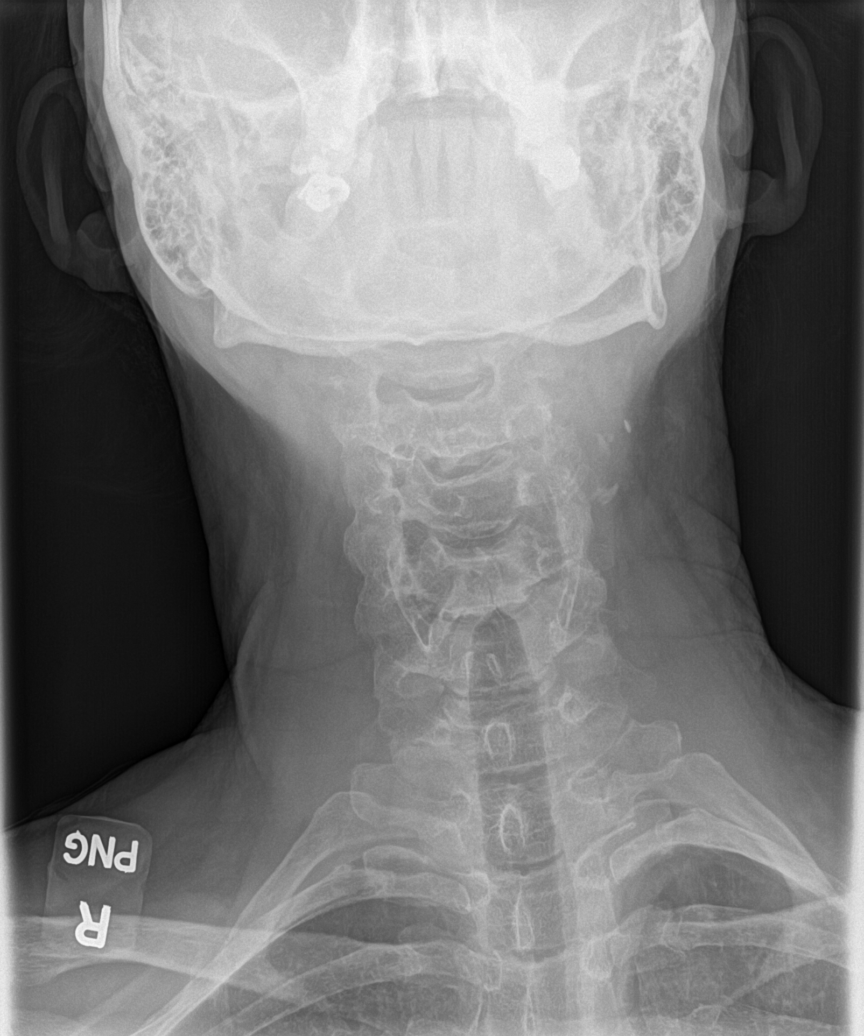

[c-spine open mouth (1 of 2)]
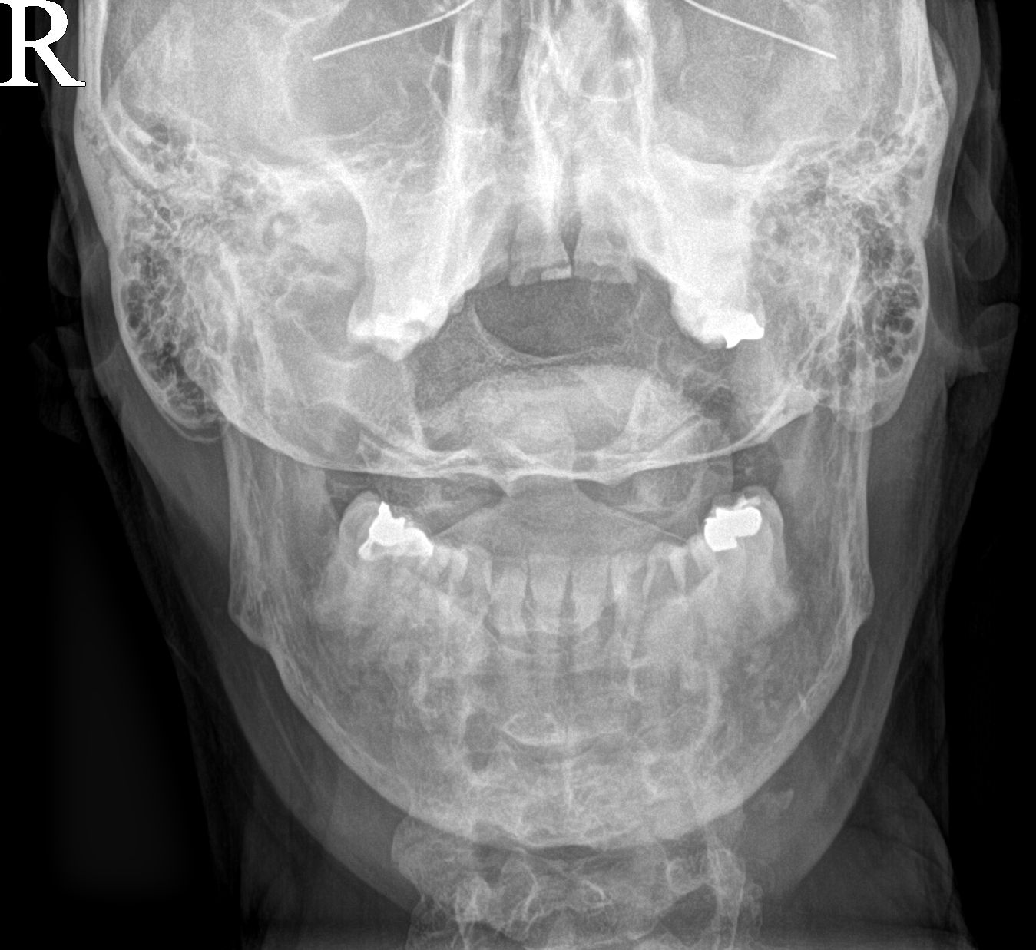

[c-spine open mouth (2 of 2)]
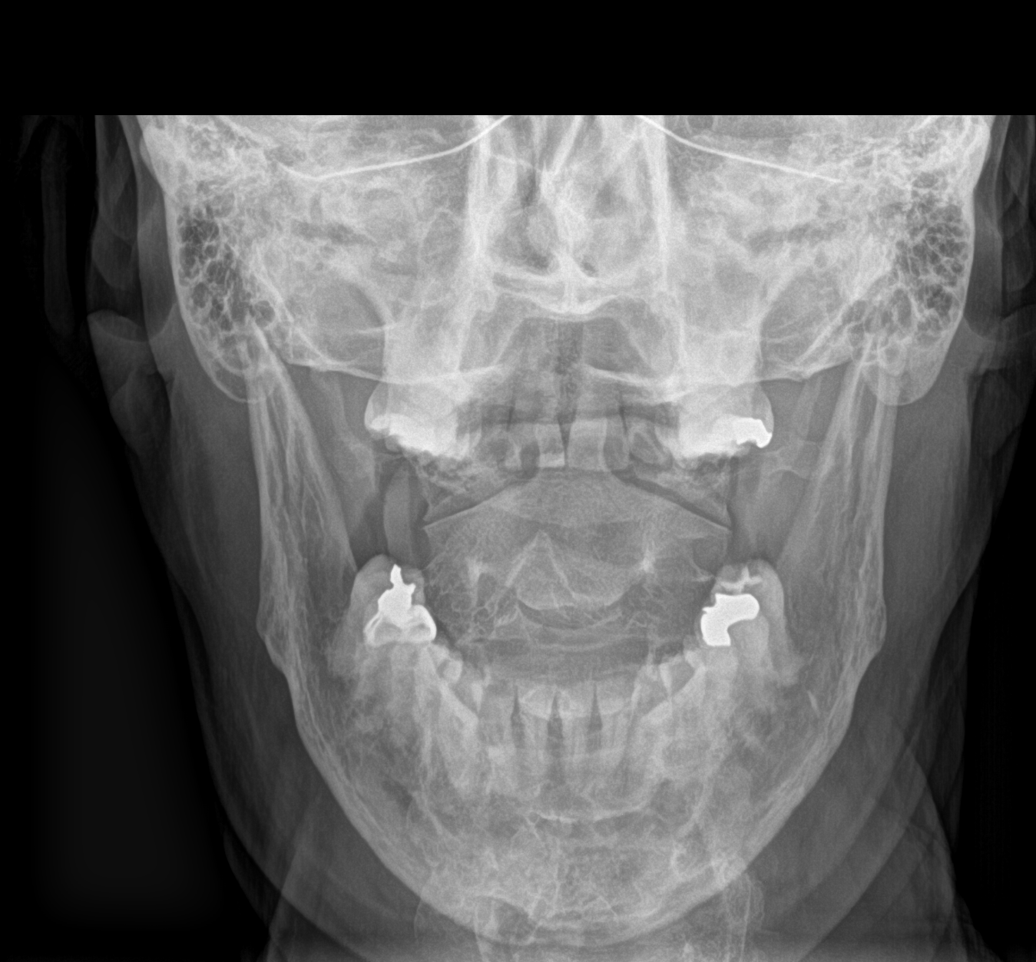

[6 of 6 positions shown; findings below may reference images not displayed]

FINDINGS: Osseous demineralization.

Prevertebral soft tissues normal thickness.

Disc space narrowing and endplate for spur formation at C6-C7.

Vertebral body and disc space heights otherwise maintained.

No fracture, subluxation, or bone destruction.

Bony foramina patent.

Slight lateral cervical flexion to the RIGHT.

Atherosclerotic calcifications at LEFT carotid bifurcation.
IMPRESSION: Degenerative disc disease changes C6-C7.

Otherwise negative exam.

## 2021-10-29 IMAGING — DX DG ELBOW COMPLETE 3+V*R*
4 series · 4 of 4 positions shown · non-contrast
Comparison: None

CLINICAL DATA: RIGHT elbow pain post fall in parking lot

EXAM:
RIGHT ELBOW - COMPLETE 3+ VIEW

[elbow ap]
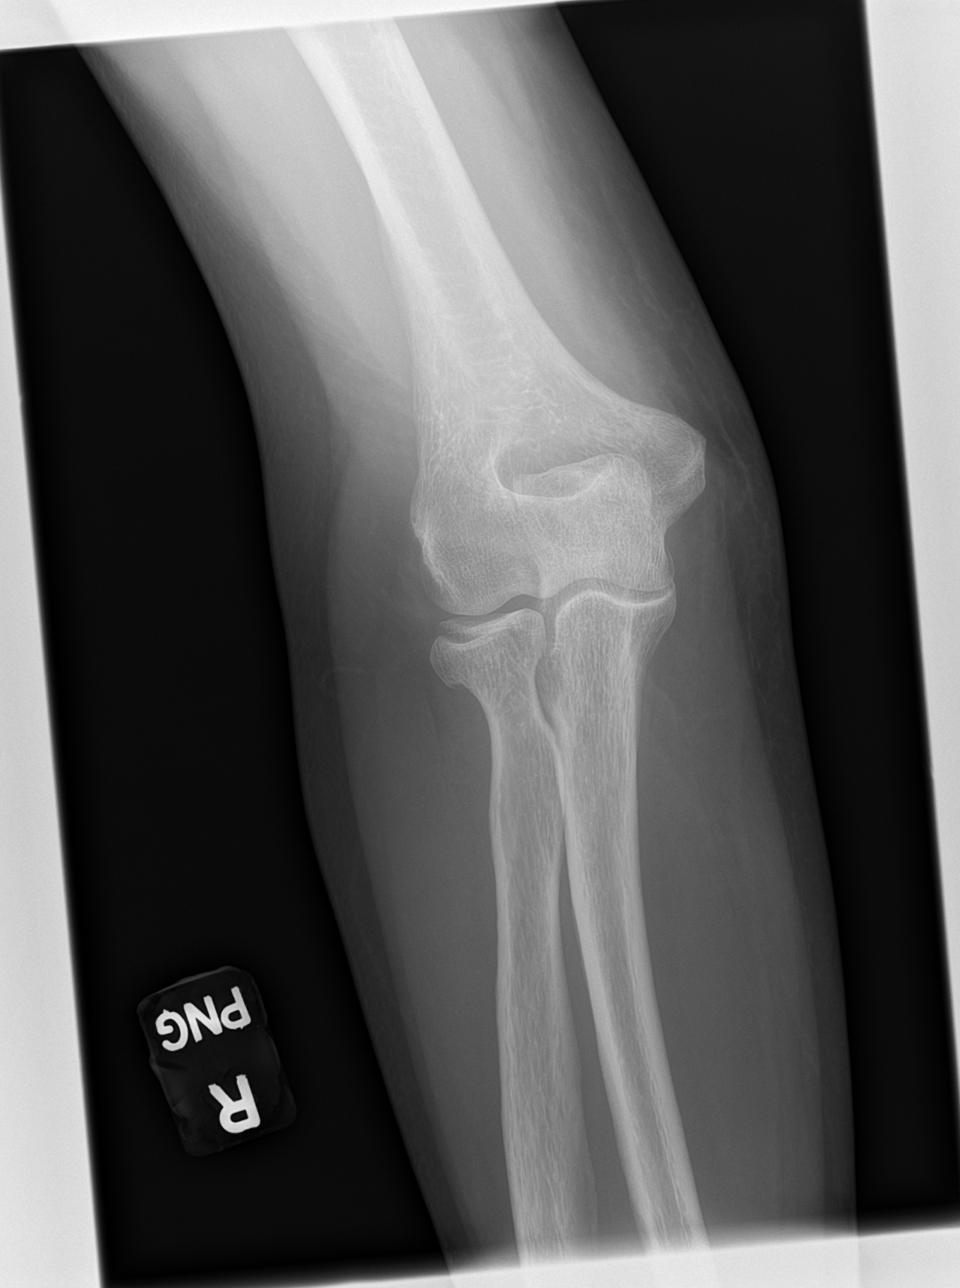

[elbow obl (1 of 2)]
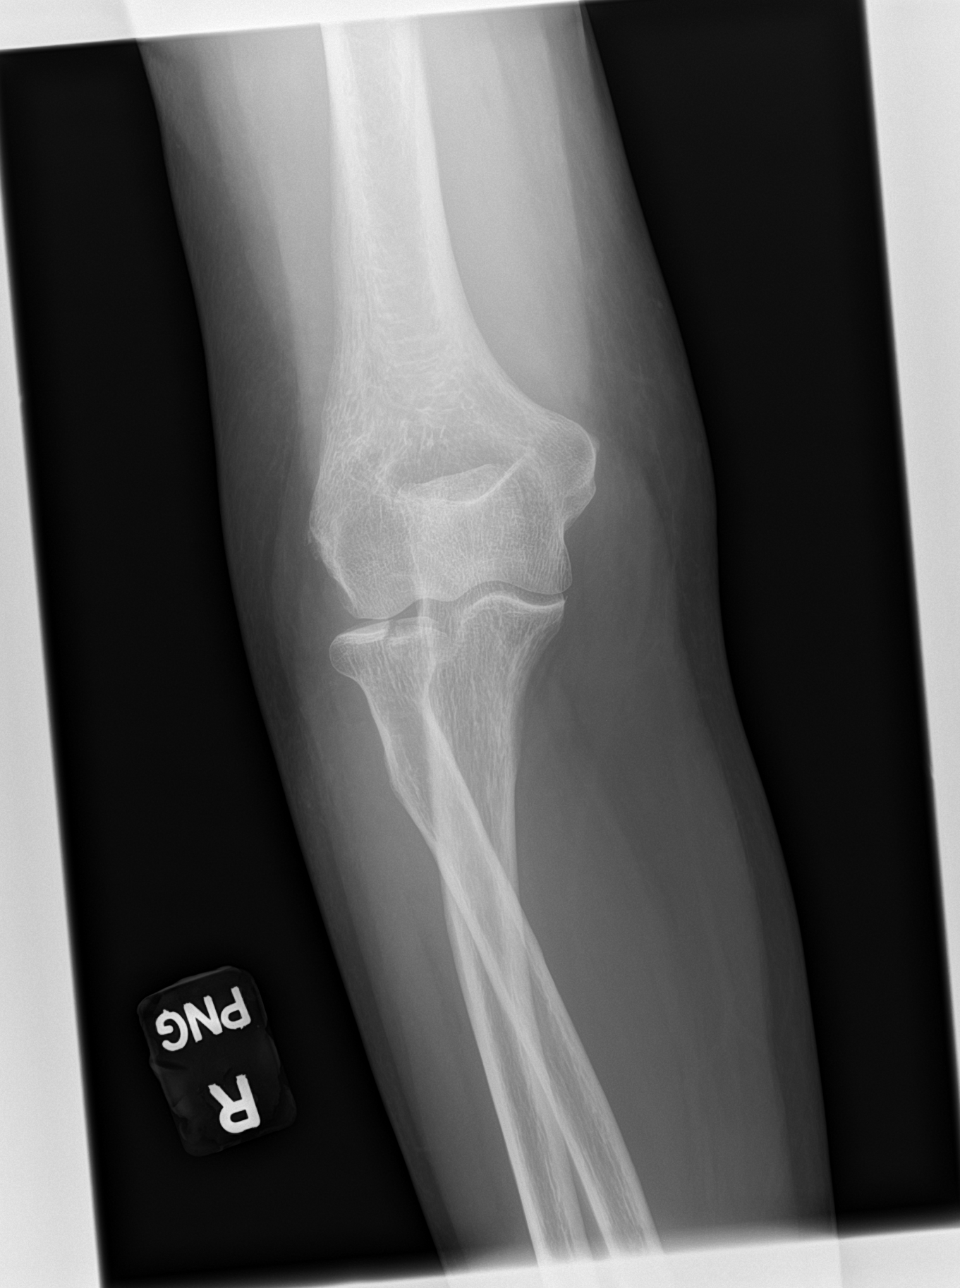

[elbow obl (2 of 2)]
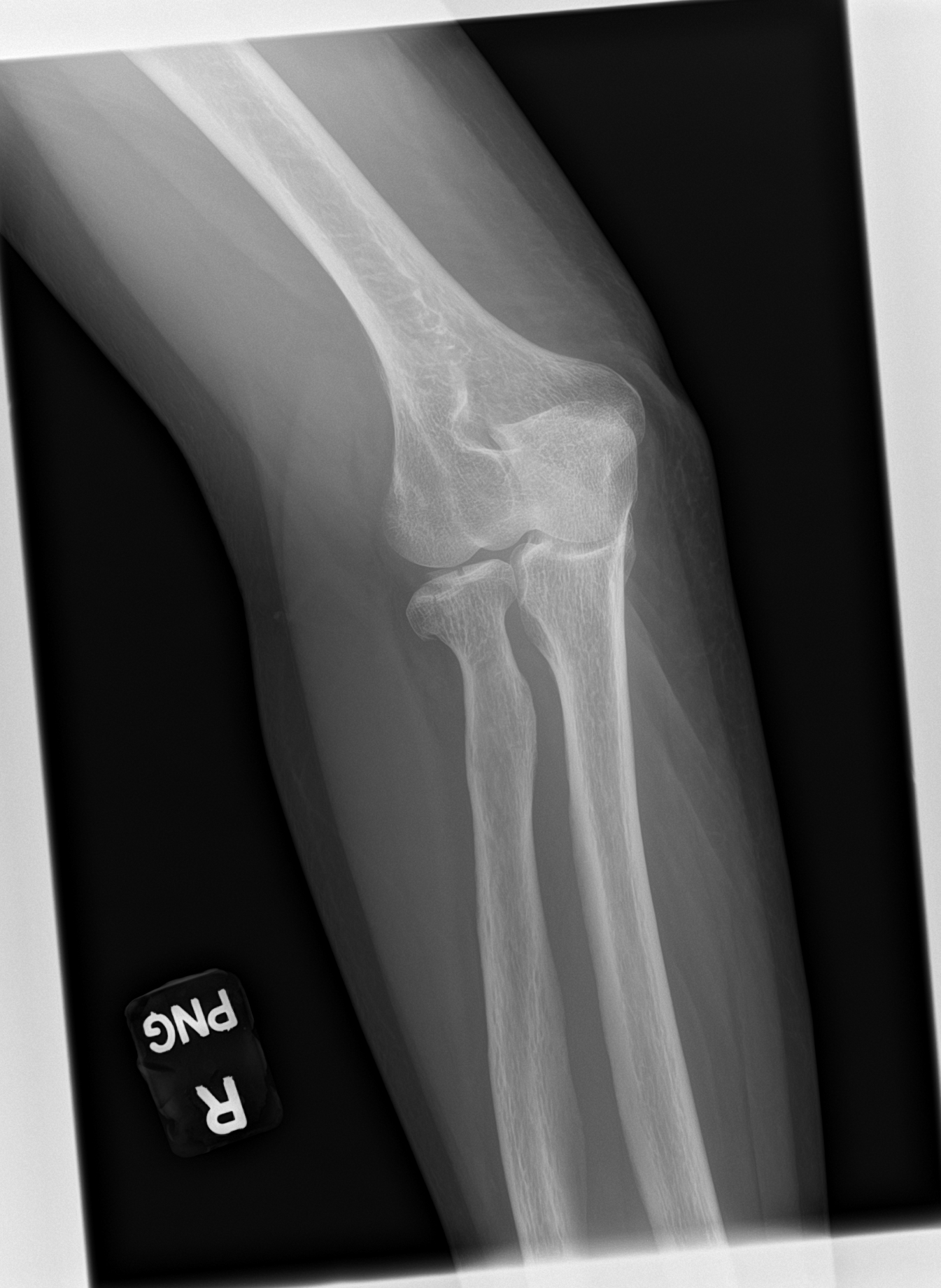

[elbow lat]
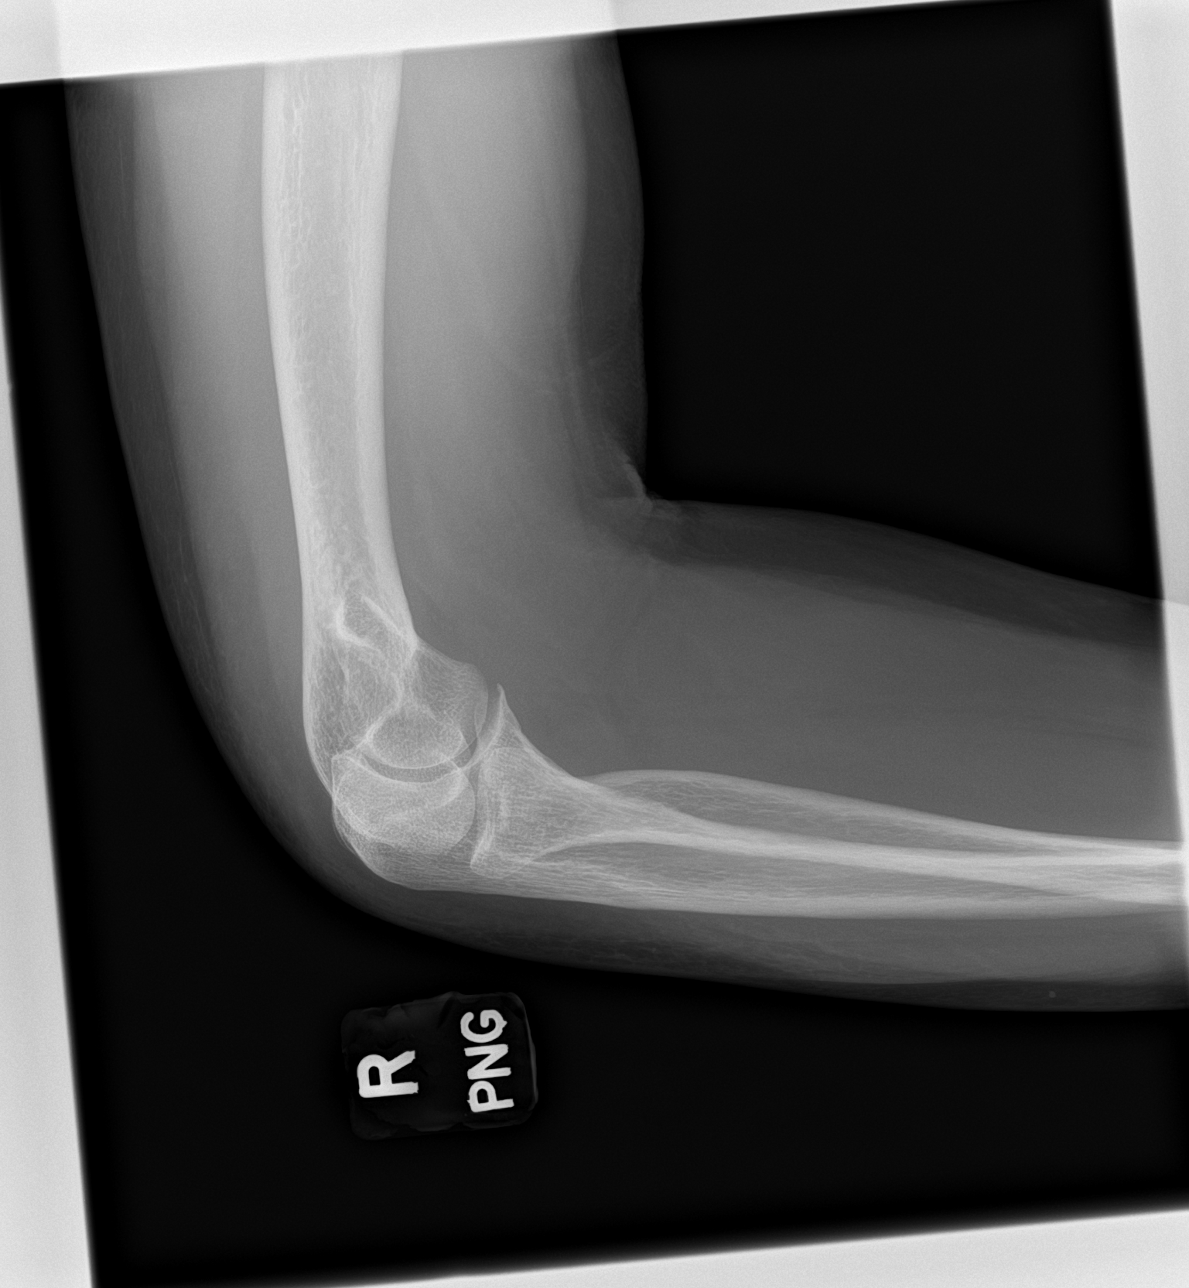

[4 of 4 positions shown; findings below may reference images not displayed]

FINDINGS: Osseous demineralization.

Joint spaces preserved.

Intra-articular fracture RIGHT radial head, mildly displaced.

Associated elbow joint effusion.

No additional fracture, dislocation, or bone destruction.
IMPRESSION: Displaced intra-articular fracture of the RIGHT radial head with
associated joint effusion.

## 2021-12-03 NOTE — Progress Notes (Unsigned)
[name] [address] [place] [address] Phone: [phone] Subjective:    I'm seeing this patient by the request  of:  Karie Schwalbe, MD  CC:   [id number]  Susan Stephens is a 61 y.o. female coming in with complaint of R foot pain. Last seen for 12/2020 for elbow pain. Patient states       No past medical history on file. Past Surgical History:  Procedure Laterality Date   ABDOMINAL HYSTERECTOMY     AUGMENTATION MAMMAPLASTY     Social History   Socioeconomic History   Marital status: Married    Spouse name: Not on file   Number of children: Not on file   Years of education: Not on file   Highest education level: Not on file  Occupational History   Not on file  Tobacco Use   Smoking status: Never   Smokeless tobacco: Never  Substance and Sexual Activity   Alcohol use: Yes    Comment: occasional   Drug use: No   Sexual activity: Not on file  Other Topics Concern   Not on file  Social History Narrative   Not on file   Social Determinants of Health   Financial Resource Strain: Not on file  Food Insecurity: Not on file  Transportation Needs: Not on file  Physical Activity: Not on file  Stress: Not on file  Social Connections: Not on file   Allergies  Allergen Reactions   Latex     Causes irritation   Family History  Problem Relation Age of Onset   Breast cancer Maternal Aunt     Current Outpatient Medications (Endocrine & Metabolic):    predniSONE (DELTASONE) 50 MG tablet, Take 1 tablet (50 mg total) by mouth daily.    Current Outpatient Medications (Analgesics):    acetaminophen (TYLENOL) 325 MG tablet, Take 650 mg by mouth every 6 (six) hours as needed for mild pain, moderate pain or headache.   Ibuprofen-Famotidine 800-26.6 MG TABS, Take 1 tablet three times daily as needed.   meloxicam (MOBIC) 15 MG tablet, TAKE 1 TABLET BY MOUTH EVERY DAY   Current Outpatient Medications (Other):    diazepam  (VALIUM) 5 MG tablet, One tab by mouth, 2 hours before procedure.   Diclofenac Sodium 2 % SOLN, Apply 1 pump twice daily.   gabapentin (NEURONTIN) 100 MG capsule, TAKE 2 CAPSULES (200 MG TOTAL) BY MOUTH AT BEDTIME.   gabapentin (NEURONTIN) 300 MG capsule, nightly   Vitamin D, Ergocalciferol, (DRISDOL) 50000 UNITS CAPS capsule, Take by mouth.   Vitamin D, Ergocalciferol, (DRISDOL) 50000 units CAPS capsule, Take 1 capsule (50,000 Units total) by mouth every 7 (seven) days.   Reviewed prior external information including notes and imaging from  primary care provider As well as notes that were available from care everywhere and other healthcare systems.  Past medical history, social, surgical and family history all reviewed in electronic medical record.  No pertanent information unless stated regarding to the chief complaint.   Review of Systems:  No headache, visual changes, nausea, vomiting, diarrhea, constipation, dizziness, abdominal pain, skin rash, fevers, chills, night sweats, weight loss, swollen lymph nodes, body aches, joint swelling, chest pain, shortness of breath, mood changes. POSITIVE muscle aches  Objective  There were no vitals taken for this visit.   General: No apparent distress alert and oriented x3 mood and affect normal, dressed appropriately.  HEENT: Pupils equal, extraocular movements intact  Respiratory: Patient's speak in full  sentences and does not appear short of breath  Cardiovascular: No lower extremity edema, non tender, no erythema      Impression and Recommendations:

## 2021-12-04 ENCOUNTER — Encounter: Payer: Self-pay | Admitting: Family Medicine

## 2021-12-04 ENCOUNTER — Ambulatory Visit: Payer: Self-pay

## 2021-12-04 ENCOUNTER — Ambulatory Visit (INDEPENDENT_AMBULATORY_CARE_PROVIDER_SITE_OTHER): Payer: PRIVATE HEALTH INSURANCE | Admitting: Family Medicine

## 2021-12-04 VITALS — BP 116/74 | HR 69 | Ht 65.0 in | Wt 155.0 lb

## 2021-12-04 DIAGNOSIS — M79671 Pain in right foot: Secondary | ICD-10-CM

## 2021-12-04 DIAGNOSIS — M722 Plantar fascial fibromatosis: Secondary | ICD-10-CM | POA: Insufficient documentation

## 2021-12-04 NOTE — Assessment & Plan Note (Signed)
Patient given injection and tolerated the procedure well.  Patient tolerated this will be the most beneficial to her.  Discussed with patient icing regimen and home exercises, which activities to do and which ones to avoid.  Increase activity slowly otherwise.  Discussed avoiding being barefoot.  Follow-up again 6 weeks

## 2021-12-04 NOTE — Patient Instructions (Addendum)
Injection today Do not go barefoot HOKA or OOFOS in the house Exercises See me again in

## 2022-01-14 NOTE — Progress Notes (Unsigned)
Susan Stephens [address] [place] [address] Phone: [phone] Subjective:   Susan Stephens, am serving as a scribe for Susan Stephens.  I'm seeing this patient by the request  of:  Susan Schwalbe, Susan Stephens  CC: Right foot pain follow-up  [id number]  12/04/2021 Patient given injection and tolerated the procedure well.  Patient tolerated this will be the most beneficial to her.  Discussed with patient icing regimen and home exercises, which activities to do and which ones to avoid.  Increase activity slowly otherwise.  Discussed avoiding being barefoot.  Follow-up again 6 weeks  Update 01/15/2022 Susan Stephens is a 61 y.o. female coming in with complaint of R foot pain. Patient states not any better. Foot is numb more than usual. Right hip was "out" over the weekend and she has been having right side nerve pain from hip down. The toe scrunch exercise does help, and she continues to walk daily through the pain. The worse pain is when she is done walking or is at rest at has to get going again.       No past medical history on file. Past Surgical History:  Procedure Laterality Date   ABDOMINAL HYSTERECTOMY     AUGMENTATION MAMMAPLASTY     Social History   Socioeconomic History   Marital status: Married    Spouse name: Not on file   Number of children: Not on file   Years of education: Not on file   Highest education level: Not on file  Occupational History   Not on file  Tobacco Use   Smoking status: Never   Smokeless tobacco: Never  Substance and Sexual Activity   Alcohol use: Yes    Comment: occasional   Drug use: No   Sexual activity: Not on file  Other Topics Concern   Not on file  Social History Narrative   Not on file   Social Determinants of Health   Financial Resource Strain: Not on file  Food Insecurity: Not on file  Transportation Needs: Not on file  Physical Activity: Not on file  Stress: Not on file  Social  Connections: Not on file   Allergies  Allergen Reactions   Latex     Causes irritation   Family History  Problem Relation Age of Onset   Breast cancer Maternal Aunt     Current Outpatient Medications (Endocrine & Metabolic):    predniSONE (DELTASONE) 50 MG tablet, Take 1 tablet (50 mg total) by mouth daily.    Current Outpatient Medications (Analgesics):    acetaminophen (TYLENOL) 325 MG tablet, Take 650 mg by mouth every 6 (six) hours as needed for mild pain, moderate pain or headache.   Ibuprofen-Famotidine 800-26.6 MG TABS, Take 1 tablet three times daily as needed.   meloxicam (MOBIC) 15 MG tablet, TAKE 1 TABLET BY MOUTH EVERY DAY   Current Outpatient Medications (Other):    diazepam (VALIUM) 5 MG tablet, One tab by mouth, 2 hours before procedure.   Diclofenac Sodium 2 % SOLN, Apply 1 pump twice daily.   gabapentin (NEURONTIN) 100 MG capsule, TAKE 2 CAPSULES (200 MG TOTAL) BY MOUTH AT BEDTIME.   gabapentin (NEURONTIN) 300 MG capsule, nightly   Vitamin D, Ergocalciferol, (DRISDOL) 50000 UNITS CAPS capsule, Take by mouth.   Vitamin D, Ergocalciferol, (DRISDOL) 50000 units CAPS capsule, Take 1 capsule (50,000 Units total) by mouth every 7 (seven) days.   Reviewed prior external information including notes and imaging from  primary care provider As well as notes that were available from care everywhere and other healthcare systems.  Past medical history, social, surgical and family history all reviewed in electronic medical record.  No pertanent information unless stated regarding to the chief complaint.   Review of Systems:  No headache, visual changes, nausea, vomiting, diarrhea, constipation, dizziness, abdominal pain, skin rash, fevers, chills, night sweats, weight loss, swollen lymph nodes, body aches, joint swelling, chest pain, shortness of breath, mood changes. POSITIVE muscle aches  Objective  Blood pressure 110/76, pulse 81, height 5\' 5"  (1.651 m), weight 156 lb  (70.8 kg), SpO2 93 %.   General: No apparent distress alert and oriented x3 mood and affect normal, dressed appropriately.  HEENT: Pupils equal, extraocular movements intact  Respiratory: Patient's speak in full sentences and does not appear short of breath  Cardiovascular: No lower extremity edema, non tender, no erythema  Right foot exam does have some tenderness to palpation still over the medial calcaneal area.  Patient does have some breakdown of the longitudinal arch noted.  Limited muscular skeletal ultrasound was performed and interpreted by , M  Limited ultrasound shows the patient still has significant thickening of the plantar fascia at the calcaneal area. Impression: Continued plantar fasciitis    Impression and Recommendations:

## 2022-01-15 ENCOUNTER — Ambulatory Visit (INDEPENDENT_AMBULATORY_CARE_PROVIDER_SITE_OTHER): Payer: PRIVATE HEALTH INSURANCE | Admitting: Family Medicine

## 2022-01-15 ENCOUNTER — Encounter: Payer: Self-pay | Admitting: Family Medicine

## 2022-01-15 ENCOUNTER — Ambulatory Visit: Payer: Self-pay

## 2022-01-15 VITALS — BP 110/76 | HR 81 | Ht 65.0 in | Wt 156.0 lb

## 2022-01-15 DIAGNOSIS — M722 Plantar fascial fibromatosis: Secondary | ICD-10-CM

## 2022-01-15 DIAGNOSIS — M79671 Pain in right foot: Secondary | ICD-10-CM

## 2022-01-15 NOTE — Patient Instructions (Signed)
PRP setup appointment Continue everything else at this time

## 2022-01-15 NOTE — Assessment & Plan Note (Signed)
Discussed again at great length.  Discussed with patient about the possibility of PRP.  After discussing different options of this as well as advanced imaging patient did decide on it and will do it in the near future.

## 2022-01-29 NOTE — Progress Notes (Unsigned)
  [place] Phone: [phone] Subjective:   Fontaine No, am serving as a scribe for Dr. Hulan Saas.  I'm seeing this patient by the request  of:  Venia Carbon, MD  CC: Right foot pain  [id number]  01/15/2022 Discussed again at great length.  Discussed with patient about the possibility of PRP.  After discussing different options of this as well as advanced imaging patient did decide on it and will do it in the near future.  Update 01/31/2031 MARIETA MARKOV is a 61 y.o. female coming in with complaint of R foot pain. Patient states that her pain is worsening.  Procedure: Real-time Ultrasound Guided Injection of right plantar fascia Device: GE Logiq Q7 Ultrasound guided injection is preferred based studies that show increased duration, increased effect, greater accuracy, decreased procedural pain, increased response rate, and decreased cost with ultrasound guided versus blind injection.  Verbal informed consent obtained.  Time-out conducted.  Noted no overlying erythema, induration, or other signs of local infection.  Skin prepped in a sterile fashion.  Local anesthesia: Topical Ethyl chloride.  With sterile technique and under real time ultrasound guidance: With a 21-gauge 2 inch needle injecting in the medial side of the calcaneal area with a total of 0.5 cc of 0.5% Marcaine and then injected 3 cc of PRP. Completed without difficulty  Pain immediately improved suggesting accurate placement of the medication.  Advised to call if fevers/chills, erythema, induration, drainage, or persistent bleeding.  Impression: Technically successful ultrasound guided injection.     Impression and Recommendations:     The above documentation has been reviewed and is accurate and complete Lyndal Pulley, DO

## 2022-01-30 ENCOUNTER — Ambulatory Visit: Payer: Self-pay | Admitting: Family Medicine

## 2022-01-30 ENCOUNTER — Ambulatory Visit: Payer: Self-pay

## 2022-01-30 ENCOUNTER — Encounter: Payer: Self-pay | Admitting: Family Medicine

## 2022-01-30 VITALS — BP 122/78 | HR 60 | Ht 65.0 in | Wt 155.0 lb

## 2022-01-30 DIAGNOSIS — M79671 Pain in right foot: Secondary | ICD-10-CM

## 2022-01-30 DIAGNOSIS — M722 Plantar fascial fibromatosis: Secondary | ICD-10-CM

## 2022-01-30 NOTE — Assessment & Plan Note (Signed)
AP given, post PRP protocol given.  We will follow-up again in 6 weeks.

## 2022-01-30 NOTE — Patient Instructions (Signed)
No ice or IBU for 3 days Heat and Tylenol are ok See me again in 6 weeks 

## 2022-03-13 NOTE — Progress Notes (Signed)
[name] [address] [place] [address] Phone: [phone] Subjective:   Susan Stephens, am serving as a scribe for Dr. Antoine Primas.  I'm seeing this patient by the request  of:  Karie Schwalbe, MD  CC: Back and foot pain follow-up  [id number]  01/30/2022 AP given, post PRP protocol given.  We will follow-up again in 6 weeks.     Update 03/18/2022 Susan Stephens is a 61 y.o. female coming in with complaint of R foot pain. Patient states  that the PRP did not do much and patient is taking too much ibuprofen daily and is worried about her liver. Patient does rotate the ibuprofen and aspirin.  States that she continues to have some discomfort and does need to take her ibuprofen on a very regular basis.  Concerned that this could be potentially irritating her kidneys.  States that the only way that she continue continue to be active otherwise though.      No past medical history on file. Past Surgical History:  Procedure Laterality Date   ABDOMINAL HYSTERECTOMY     AUGMENTATION MAMMAPLASTY     Social History   Socioeconomic History   Marital status: Married    Spouse name: Not on file   Number of children: Not on file   Years of education: Not on file   Highest education level: Not on file  Occupational History   Not on file  Tobacco Use   Smoking status: Never   Smokeless tobacco: Never  Substance and Sexual Activity   Alcohol use: Yes    Comment: occasional   Drug use: No   Sexual activity: Not on file  Other Topics Concern   Not on file  Social History Narrative   Not on file   Social Determinants of Health   Financial Resource Strain: Not on file  Food Insecurity: Not on file  Transportation Needs: Not on file  Physical Activity: Not on file  Stress: Not on file  Social Connections: Not on file   Allergies  Allergen Reactions   Latex     Causes irritation   Family History  Problem Relation Age of  Onset   Breast cancer Maternal Aunt     Current Outpatient Medications (Endocrine & Metabolic):    predniSONE (DELTASONE) 50 MG tablet, Take 1 tablet (50 mg total) by mouth daily. (Patient not taking: Reported on 03/18/2022)    Current Outpatient Medications (Analgesics):    acetaminophen (TYLENOL) 325 MG tablet, Take 650 mg by mouth every 6 (six) hours as needed for mild pain, moderate pain or headache.   Ibuprofen-Famotidine 800-26.6 MG TABS, Take 1 tablet three times daily as needed.   meloxicam (MOBIC) 15 MG tablet, TAKE 1 TABLET BY MOUTH EVERY DAY (Patient not taking: Reported on 03/18/2022)   Current Outpatient Medications (Other):    Diclofenac Sodium 2 % SOLN, Apply 1 pump twice daily.   Vitamin D, Ergocalciferol, (DRISDOL) 50000 UNITS CAPS capsule, Take by mouth.   Vitamin D, Ergocalciferol, (DRISDOL) 50000 units CAPS capsule, Take 1 capsule (50,000 Units total) by mouth every 7 (seven) days.   diazepam (VALIUM) 5 MG tablet, One tab by mouth, 2 hours before procedure. (Patient not taking: Reported on 03/18/2022)   gabapentin (NEURONTIN) 100 MG capsule, TAKE 2 CAPSULES (200 MG TOTAL) BY MOUTH AT BEDTIME. (Patient not taking: Reported on 03/18/2022)   gabapentin (NEURONTIN) 300 MG capsule, nightly (Patient not taking: Reported on 03/18/2022)   Reviewed  prior external information including notes and imaging from  primary care provider As well as notes that were available from care everywhere and other healthcare systems.  Past medical history, social, surgical and family history all reviewed in electronic medical record.  No pertanent information unless stated regarding to the chief complaint.   Review of Systems:  No headache, visual changes, nausea, vomiting, diarrhea, constipation, dizziness, abdominal pain, skin rash, fevers, chills, night sweats, weight loss, swollen lymph nodes, body aches, joint swelling, chest pain, shortness of breath, mood changes. POSITIVE muscle  aches  Objective  Blood pressure 110/70, pulse 61, height 5\' 5"  (1.651 m), SpO2 98 %.   General: No apparent distress alert and oriented x3 mood and affect normal, dressed appropriately.  HEENT: Pupils equal, extraocular movements intact  Respiratory: Patient's speak in full sentences and does not appear short of breath  Cardiovascular: No lower extremity edema, non tender, no erythema  Upper back does have loss lordosis Patient does have tightness noted in the paraspinal musculature.  Patient does have tightness with FABER test right greater than left.  Right foot exam does show some tenderness to palpation at the calcaneal area.  More on the lateral than medial and on the plantar aspect.  Limited muscular skeletal ultrasound was performed and interpreted by , M  Limited ultrasound shows significant decrease in hypoechoic changes but still has the inflammation or the increase in size of the plantar fascia.  No calcaneal spur noted.  Osteopathic findings  T9 extended rotated and side bent left L2 flexed rotated and side bent right Sacrum right on right     Impression and Recommendations:     The above documentation has been reviewed and is accurate and complete Antoine Primas, DO

## 2022-03-18 ENCOUNTER — Ambulatory Visit: Payer: Self-pay

## 2022-03-18 ENCOUNTER — Ambulatory Visit (INDEPENDENT_AMBULATORY_CARE_PROVIDER_SITE_OTHER): Payer: PRIVATE HEALTH INSURANCE | Admitting: Family Medicine

## 2022-03-18 VITALS — BP 110/70 | HR 61 | Ht 65.0 in

## 2022-03-18 DIAGNOSIS — M79671 Pain in right foot: Secondary | ICD-10-CM | POA: Diagnosis not present

## 2022-03-18 DIAGNOSIS — M2550 Pain in unspecified joint: Secondary | ICD-10-CM

## 2022-03-18 DIAGNOSIS — M999 Biomechanical lesion, unspecified: Secondary | ICD-10-CM

## 2022-03-18 DIAGNOSIS — M9903 Segmental and somatic dysfunction of lumbar region: Secondary | ICD-10-CM | POA: Diagnosis not present

## 2022-03-18 DIAGNOSIS — M9904 Segmental and somatic dysfunction of sacral region: Secondary | ICD-10-CM | POA: Diagnosis not present

## 2022-03-18 DIAGNOSIS — M722 Plantar fascial fibromatosis: Secondary | ICD-10-CM

## 2022-03-18 DIAGNOSIS — M9902 Segmental and somatic dysfunction of thoracic region: Secondary | ICD-10-CM

## 2022-03-18 DIAGNOSIS — G5702 Lesion of sciatic nerve, left lower limb: Secondary | ICD-10-CM

## 2022-03-18 LAB — CBC WITH DIFFERENTIAL/PLATELET
Basophils Absolute: 0 10*3/uL (ref 0.0–0.1)
Basophils Relative: 0.5 % (ref 0.0–3.0)
Eosinophils Absolute: 0.3 10*3/uL (ref 0.0–0.7)
Eosinophils Relative: 3.9 % (ref 0.0–5.0)
HCT: 43.6 % (ref 36.0–46.0)
Hemoglobin: 14.7 g/dL (ref 12.0–15.0)
Lymphocytes Relative: 33.7 % (ref 12.0–46.0)
Lymphs Abs: 2.4 10*3/uL (ref 0.7–4.0)
MCHC: 33.8 g/dL (ref 30.0–36.0)
MCV: 91.4 fl (ref 78.0–100.0)
Monocytes Absolute: 0.7 10*3/uL (ref 0.1–1.0)
Monocytes Relative: 10 % (ref 3.0–12.0)
Neutro Abs: 3.6 10*3/uL (ref 1.4–7.7)
Neutrophils Relative %: 51.9 % (ref 43.0–77.0)
Platelets: 337 10*3/uL (ref 150.0–400.0)
RBC: 4.77 Mil/uL (ref 3.87–5.11)
RDW: 12.7 % (ref 11.5–15.5)
WBC: 7 10*3/uL (ref 4.0–10.5)

## 2022-03-18 LAB — COMPREHENSIVE METABOLIC PANEL
ALT: 29 U/L (ref 0–35)
AST: 24 U/L (ref 0–37)
Albumin: 4.8 g/dL (ref 3.5–5.2)
Alkaline Phosphatase: 71 U/L (ref 39–117)
BUN: 16 mg/dL (ref 6–23)
CO2: 31 mEq/L (ref 19–32)
Calcium: 9.7 mg/dL (ref 8.4–10.5)
Chloride: 100 mEq/L (ref 96–112)
Creatinine, Ser: 0.84 mg/dL (ref 0.40–1.20)
GFR: 76.02 mL/min (ref >60.00)
Glucose, Bld: 90 mg/dL (ref 70–99)
Potassium: 4.3 mEq/L (ref 3.5–5.1)
Sodium: 136 mEq/L (ref 135–145)
Total Bilirubin: 0.3 mg/dL (ref 0.2–1.2)
Total Protein: 7.7 g/dL (ref 6.0–8.3)

## 2022-03-18 LAB — TSH: TSH: 2.5 u[IU]/mL (ref 0.35–5.50)

## 2022-03-18 LAB — SEDIMENTATION RATE: Sed Rate: 8 mm/hr (ref 0–30)

## 2022-03-18 NOTE — Patient Instructions (Addendum)
Good to see you  Small heel lifts 2/16 of an inch: [email or web address] Follow up in 6-8 weeks

## 2022-03-18 NOTE — Assessment & Plan Note (Signed)
Responded well to osteopathic manipulation today.  This is not something that is the only diagnosis.  Could be more of the L5-S1 herniated disc that patient has had previously as well.  Discussed with patient about icing regimen and home exercises.  Discussed core strengthening.  If continuing to have difficulty could consider advanced imaging but hopefully patient will continue to make some improvement.  This is probably another reason why patient continues to have difficulty with her foot.  We will also check labs secondary to patient's ibuprofen use

## 2022-03-18 NOTE — Assessment & Plan Note (Signed)
   Decision today to treat with OMT was based on Physical Exam  After verbal consent patient was treated with HVLA, ME, FPR techniques in  lumbar and sacral areas, all areas are chronic   Patient tolerated the procedure well with improvement in symptoms  Patient given exercises, stretches and lifestyle modifications  See medications in patient instructions if given  Patient will follow up in 4-8 weeks 

## 2022-03-18 NOTE — Assessment & Plan Note (Signed)
Unfortunately patient has not responded extremely well to the PRP.  Patient does have significant decrease in the hypoechoic changes but unfortunately continues to have the thickening of the plantar fascia.  Patient given some heel lifts that I think may be more beneficial with the severity of the hardness of some of her shoes.  We discussed the recovery sandals again.  Continuing the exercises.  Follow-up again in 6 to 8 weeks.

## 2022-05-06 NOTE — Progress Notes (Deleted)
[place] Phone: [phone] Subjective:    I'm seeing this patient by the request  of:  Venia Carbon, MD  CC:   [id number]  03/18/2022 Responded well to osteopathic manipulation today.  This is not something that is the only diagnosis.  Could be more of the L5-S1 herniated disc that patient has had previously as well.  Discussed with patient about icing regimen and home exercises.  Discussed core strengthening.  If continuing to have difficulty could consider advanced imaging but hopefully patient will continue to make some improvement.  This is probably another reason why patient continues to have difficulty with her foot.  We will also check labs secondary to patient's ibuprofen use     Unfortunately patient has not responded extremely well to the PRP. Patient does have significant decrease in the hypoechoic changes but unfortunately continues to have the thickening of the plantar fascia. Patient given some heel lifts that I think may be more beneficial with the severity of the hardness of some of her shoes. We discussed the recovery sandals again. Continuing the exercises. Follow-up again in 6 to 8 weeks.   Update 05/13/2022 Susan Stephens is a 62 y.o. female coming in with complaint of R foot and L hip pain. Patient states       No past medical history on file. Past Surgical History:  Procedure Laterality Date   ABDOMINAL HYSTERECTOMY     AUGMENTATION MAMMAPLASTY     Social History   Socioeconomic History   Marital status: Married    Spouse name: Not on file   Number of children: Not on file   Years of education: Not on file   Highest education level: Not on file  Occupational History   Not on file  Tobacco Use   Smoking status: Never   Smokeless tobacco: Never  Substance and Sexual Activity   Alcohol use: Yes    Comment: occasional   Drug use: No   Sexual activity: Not on file  Other  Topics Concern   Not on file  Social History Narrative   Not on file   Social Determinants of Health   Financial Resource Strain: Not on file  Food Insecurity: Not on file  Transportation Needs: Not on file  Physical Activity: Not on file  Stress: Not on file  Social Connections: Not on file   Allergies  Allergen Reactions   Latex     Causes irritation   Family History  Problem Relation Age of Onset   Breast cancer Maternal Aunt     Current Outpatient Medications (Endocrine & Metabolic):    predniSONE (DELTASONE) 50 MG tablet, Take 1 tablet (50 mg total) by mouth daily. (Patient not taking: Reported on 03/18/2022)    Current Outpatient Medications (Analgesics):    acetaminophen (TYLENOL) 325 MG tablet, Take 650 mg by mouth every 6 (six) hours as needed for mild pain, moderate pain or headache.   Ibuprofen-Famotidine 800-26.6 MG TABS, Take 1 tablet three times daily as needed.   meloxicam (MOBIC) 15 MG tablet, TAKE 1 TABLET BY MOUTH EVERY DAY (Patient not taking: Reported on 03/18/2022)   Current Outpatient Medications (Other):    diazepam (VALIUM) 5 MG tablet, One tab by mouth, 2 hours before procedure. (Patient not taking: Reported on 03/18/2022)   Diclofenac Sodium 2 % SOLN, Apply 1 pump twice daily.   gabapentin (NEURONTIN) 100 MG capsule, TAKE 2 CAPSULES (200 MG TOTAL) BY  MOUTH AT BEDTIME. (Patient not taking: Reported on 03/18/2022)   gabapentin (NEURONTIN) 300 MG capsule, nightly (Patient not taking: Reported on 03/18/2022)   Vitamin D, Ergocalciferol, (DRISDOL) 50000 UNITS CAPS capsule, Take by mouth.   Vitamin D, Ergocalciferol, (DRISDOL) 50000 units CAPS capsule, Take 1 capsule (50,000 Units total) by mouth every 7 (seven) days.   Reviewed prior external information including notes and imaging from  primary care provider As well as notes that were available from care everywhere and other healthcare systems.  Past medical history, social, surgical and family  history all reviewed in electronic medical record.  No pertanent information unless stated regarding to the chief complaint.   Review of Systems:  No headache, visual changes, nausea, vomiting, diarrhea, constipation, dizziness, abdominal pain, skin rash, fevers, chills, night sweats, weight loss, swollen lymph nodes, body aches, joint swelling, chest pain, shortness of breath, mood changes. POSITIVE muscle aches  Objective  There were no vitals taken for this visit.   General: No apparent distress alert and oriented x3 mood and affect normal, dressed appropriately.  HEENT: Pupils equal, extraocular movements intact  Respiratory: Patient's speak in full sentences and does not appear short of breath  Cardiovascular: No lower extremity edema, non tender, no erythema      Impression and Recommendations:

## 2022-05-13 ENCOUNTER — Ambulatory Visit: Payer: PRIVATE HEALTH INSURANCE | Admitting: Family Medicine

## 2022-07-21 ENCOUNTER — Encounter: Payer: Self-pay | Admitting: Family Medicine

## 2023-04-06 ENCOUNTER — Ambulatory Visit (INDEPENDENT_AMBULATORY_CARE_PROVIDER_SITE_OTHER): Payer: Commercial Managed Care - PPO

## 2023-04-06 ENCOUNTER — Ambulatory Visit (INDEPENDENT_AMBULATORY_CARE_PROVIDER_SITE_OTHER): Payer: Commercial Managed Care - PPO | Admitting: Sports Medicine

## 2023-04-06 VITALS — BP 118/70 | HR 72 | Ht 65.0 in

## 2023-04-06 DIAGNOSIS — M545 Low back pain, unspecified: Secondary | ICD-10-CM | POA: Diagnosis not present

## 2023-04-06 DIAGNOSIS — M9903 Segmental and somatic dysfunction of lumbar region: Secondary | ICD-10-CM

## 2023-04-06 DIAGNOSIS — M25551 Pain in right hip: Secondary | ICD-10-CM

## 2023-04-06 DIAGNOSIS — M9905 Segmental and somatic dysfunction of pelvic region: Secondary | ICD-10-CM | POA: Diagnosis not present

## 2023-04-06 DIAGNOSIS — M51361 Other intervertebral disc degeneration, lumbar region with lower extremity pain only: Secondary | ICD-10-CM | POA: Diagnosis not present

## 2023-04-06 DIAGNOSIS — M9904 Segmental and somatic dysfunction of sacral region: Secondary | ICD-10-CM

## 2023-04-06 MED ORDER — MELOXICAM 15 MG PO TABS: 15.0000 mg | ORAL_TABLET | Freq: Every day | ORAL | 0 refills | Status: DC

## 2023-04-06 NOTE — Patient Instructions (Signed)
-   Start meloxicam 15 mg daily x2 weeks.  If still having pain after 2 weeks, complete 3rd-week of NSAID. May use remaining NSAID as needed once daily for pain control.  Do not to use additional over-the-counter NSAIDs (ibuprofen, naproxen, Advil, Aleve) while taking prescription NSAIDs.  May use Tylenol [phone] mg 2 to 3 times a day for breakthrough pain. Back HEP  4 week follow up

## 2023-04-06 NOTE — Progress Notes (Signed)
Susan Stephens D.[facility] [address] [place] [address] Phone: [phone]   Assessment and Plan:     1. Pain of right hip 2. Degeneration of intervertebral disc of lumbar region with lower extremity pain 3. Somatic dysfunction of lumbar region 4. Somatic dysfunction of pelvic region 5. Somatic dysfunction of sacral region -Chronic with exacerbation, subsequent sports medicine visit - Patient presents with multiple musculoskeletal concerns with most prominent being right hip discomfort, progressive numbness and tingling in right lower extremity, nighttime lateral left hip pain.  Many of these pains have lingered since going on a long hike 2 months ago.  She has a history of right sided piriformis syndrome that typically resolves with time, exercises, piriformis CSI. - I suspect patient may have pathology from L5-S1 causing lower extremity symptoms with history of surgery at this level in 2015, though patient does not present with back pain at today's visit - Start meloxicam 15 mg daily x2 weeks.  If still having pain after 2 weeks, complete 3rd-week of NSAID. May use remaining NSAID as needed once daily for pain control.  Do not to use additional over-the-counter NSAIDs (ibuprofen, naproxen, Advil, Aleve) while taking prescription NSAIDs.  May use Tylenol [phone] mg 2 to 3 times a day for breakthrough pain. - Patient to continue her exercises, and additionally add our home exercise program for low back and piriformis - Patient has received relief with OMT in the past.  Elects for repeat OMT today.  Tolerated well per note below. - Decision today to treat with OMT was based on Physical Exam -X-rays obtained in clinic.  My interpretation: No acute fracture or dislocation or vertebral collapse.  No significant changes of bilateral femoral acetabular joints.  Mild SI joint sclerosis.  Moderate loss of disc height at L5-S1 with surrounding facet  arthropathy  After verbal consent patient was treated with HVLA (high velocity low amplitude), ME (muscle energy), FPR (flex positional release), ST (soft tissue), PC/PD (Pelvic Compression/ Pelvic Decompression) techniques in sacrum, lumbar, and pelvic areas. Patient tolerated the procedure well with improvement in symptoms.  Patient educated on potential side effects of soreness and recommended to rest, hydrate, and use Tylenol as needed for pain control.   15 additional minutes spent for educating Therapeutic Home Exercise Program.  This included exercises focusing on stretching, strengthening, with focus on eccentric aspects.   Long term goals include an improvement in range of motion, strength, endurance as well as avoiding reinjury. Patient's frequency would include in 1-2 times a day, 3-5 times a week for a duration of 6-12 weeks. Proper technique shown and discussed handout in great detail with ATC.  All questions were discussed and answered.     Pertinent previous records reviewed include none  Follow Up: 4 weeks for reevaluation.  If no improvement or worsening of symptoms, could consider lumbar MRI versus PT   Subjective:   I, Susan Stephens, am serving as a scribe for Dr. Richardean Sale  Chief Complaint: piriformis pain   HPI:   04/06/23 Patient is a 62 year old female with piriformis pain. Patient states did a 9 mile hike and her piriformis was really bothering her, but now its more of her hips/groin. Right piriformis and right hip has less ROM and some weakness when doing piliates.   Relevant Historical Information: None pertinent  Additional pertinent review of systems negative.   Current Outpatient Medications:    Ibuprofen-Famotidine 800-26.6 MG TABS, Take 1 tablet three  times daily as needed., Disp: 270 tablet, Rfl: 3   meloxicam (MOBIC) 15 MG tablet, Take 1 tablet (15 mg total) by mouth daily., Disp: 30 tablet, Rfl: 0   Vitamin D, Ergocalciferol, (DRISDOL) 50000 UNITS  CAPS capsule, Take by mouth., Disp: , Rfl:    Vitamin D, Ergocalciferol, (DRISDOL) 50000 units CAPS capsule, Take 1 capsule (50,000 Units total) by mouth every 7 (seven) days., Disp: 12 capsule, Rfl: 0   acetaminophen (TYLENOL) 325 MG tablet, Take 650 mg by mouth every 6 (six) hours as needed for mild pain, moderate pain or headache., Disp: , Rfl:    Objective:     Vitals:   04/06/23 1335  BP: 118/70  Pulse: 72  SpO2: 98%  Height: 5\' 5"  (1.651 m)      Body mass index is 25.79 kg/m.    Physical Exam:    Gen: Appears well, nad, nontoxic and pleasant Psych: Alert and oriented, appropriate mood and affect Neuro: sensation intact, strength is 5/5 in upper and lower extremities, muscle tone wnl Skin: no susupicious lesions or rashes  Back - Normal skin, Spine with normal alignment and no deformity.   No tenderness to vertebral process palpation.   Paraspinous muscles are not tender and without spasm NTTP gluteal musculature Straight leg raise negative  Bilateral hip: No deformity, swelling or wasting ROM Flexion 90, ext 30, IR 35, ER 45 NTTP over the hip flexors, greater trochanter, gluteal musculature, si joint, lumbar spine Negative log roll with FROM Negative FABER Negative FADIR, though tension and tightness on right not present on left Negative Piriformis test for radicular symptoms, though tension and tightness on right not present on left Positive right trendelenberg Gait normal    OMT Physical Exam:  ASIS Compression Test: Positive Right Sacrum: Positive sphinx, NTTP bilateral sacral base Lumbar: NTTP paraspinal, L2 RLSL Pelvis: Right anterior innominate   Electronically signed by:  Susan Stephens D.[facility] 2:34 PM 04/06/23

## 2023-05-20 ENCOUNTER — Ambulatory Visit: Payer: PRIVATE HEALTH INSURANCE | Admitting: Family Medicine

## 2023-08-11 ENCOUNTER — Telehealth: Payer: Self-pay | Admitting: Family Medicine

## 2023-08-11 ENCOUNTER — Other Ambulatory Visit: Payer: Self-pay

## 2023-08-11 MED ORDER — MELOXICAM 15 MG PO TABS: 15.0000 mg | ORAL_TABLET | Freq: Every day | ORAL | 0 refills | Status: DC

## 2023-08-11 NOTE — Telephone Encounter (Signed)
 Patient called to ask if Dr. Katrinka Blazing could refill meloxicam. Patient asked if someone could message her if prescription is sent to [facility] in [place], [place].

## 2023-08-11 NOTE — Telephone Encounter (Signed)
 Rx filled

## 2023-09-01 NOTE — Progress Notes (Unsigned)
 [facility] [address] [place] [address] Phone: [phone] Subjective:   IBryan Caprio, am serving as a scribe for Dr. Ronnell Coins.  I'm seeing this patient by the request  of:  Helaine Llanos, MD  CC: hip pain   [id number]  Susan Stephens is a 63 y.o. female coming in with complaint of L hip pain. Last seen in nov 2023. Injection to GT in 2019. Patient states Can't get comfortable at night. Radiating nerve pain down thigh. Dr. Nickey Barn OMT helped, but back to returning pain.      No past medical history on file. Past Surgical History:  Procedure Laterality Date   ABDOMINAL HYSTERECTOMY     AUGMENTATION MAMMAPLASTY     Social History   Socioeconomic History   Marital status: Married    Spouse name: Not on file   Number of children: Not on file   Years of education: Not on file   Highest education level: Not on file  Occupational History   Not on file  Tobacco Use   Smoking status: Never   Smokeless tobacco: Never  Substance and Sexual Activity   Alcohol use: Yes    Comment: occasional   Drug use: No   Sexual activity: Not on file  Other Topics Concern   Not on file  Social History Narrative   Not on file   Social Drivers of Health   Financial Resource Strain: Low Risk  (07/04/2023)   Received from [facility]   Overall Financial Resource Strain (CARDIA)    Difficulty of Paying Living Expenses: Not hard at all  Food Insecurity: No Food Insecurity (07/04/2023)   Received from [facility]   Hunger Vital Sign    Worried About Running Out of Food in the Last Year: Never true    Ran Out of Food in the Last Year: Never true  Transportation Needs: No Transportation Needs (07/04/2023)   Received from [facility] - Transportation    In the past 12 months, has lack of transportation kept you from medical appointments or from getting medications?: No    Lack  of Transportation (Non-Medical): No  Physical Activity: Not on file  Stress: Not on file  Social Connections: Unknown (09/17/2021)   Received from [facility]   Social Network    Social Network: Not on file   Allergies  Allergen Reactions   Latex     Causes irritation   Family History  Problem Relation Age of Onset   Breast cancer Maternal Aunt        Current Outpatient Medications (Analgesics):    acetaminophen (TYLENOL) 325 MG tablet, Take 650 mg by mouth every 6 (six) hours as needed for mild pain, moderate pain or headache.   Ibuprofen -Famotidine  800-26.6 MG TABS, Take 1 tablet three times daily as needed.   meloxicam  (MOBIC ) 15 MG tablet, Take 1 tablet (15 mg total) by mouth daily.   Current Outpatient Medications (Other):    Vitamin D , Ergocalciferol , (DRISDOL ) 50000 UNITS CAPS capsule, Take by mouth.   Vitamin D , Ergocalciferol , (DRISDOL ) 50000 units CAPS capsule, Take 1 capsule (50,000 Units total) by mouth every 7 (seven) days.   Reviewed prior external information including notes and imaging from  primary care provider As well as notes that were available from care everywhere and other healthcare systems.  Past medical history, social, surgical and family history all reviewed in electronic  medical record.  No pertanent information unless stated regarding to the chief complaint.   Review of Systems:  No headache, visual changes, nausea, vomiting, diarrhea, constipation, dizziness, abdominal pain, skin rash, fevers, chills, night sweats, weight loss, swollen lymph nodes, body aches, joint swelling, chest pain, shortness of breath, mood changes. POSITIVE muscle aches  Objective  Blood pressure 112/70, pulse 85, height 5\' 5"  (1.651 m), weight 154 lb (69.9 kg), SpO2 96%.   General: No apparent distress alert and oriented x3 mood and affect normal, dressed appropriately.  HEENT: Pupils equal, extraocular movements intact  Respiratory: Patient's speak  in full sentences and does not appear short of breath  Cardiovascular: No lower extremity edema, non tender, no erythema  Hip exam shows tenderness to palpation noted.  Seems to be over the greater trochanteric area.  Low back does have some tightness noted in the sacroiliac joints bilaterally.  Left greater than right.  Osteopathic findings T3 extended rotated and side bent right inhaled third rib T9 extended rotated and side bent left L2 flexed rotated and side bent right Sacrum left on left  After verbal consent patient was prepped in sterile fashion with alcohol swabs. Ethyl chloride used patient was injected with a 22-gauge 3 inch needle into the left lateral hip in the greater trochanteric area under ultrasound guidance. Picture was taken. Patient had 4 cc of 0.5% Marcaine and 1 cc of Kenalog  40 mg/dL injected. Patient tolerated the procedure well and no blood loss. Pain completely resolved after injection stating proper placement. Post injection instructions given.   Impression and Recommendations:    Greater trochanteric bursitis of left hip Repeat injection given, discussed icing regimen and home exercises, discussed which activities to do and which ones to avoid.  Increase activity strain.  Discussed icing regimen.  Discussed with patient she will do well with conservative therapy.  Begin in 6 to 8 weeks  SI (sacroiliac) joint dysfunction Has did well to home exercises and icing regimen.  Patient is responding well to osteopathic manipulation.  Hopeful that this will make significant improvement.  Follow-up again 6 to 8 weeks.    Decision today to treat with OMT was based on Physical Exam  After verbal consent patient was treated with HVLA, ME, FPR techniques in  thoracic, rib, lumbar and sacral areas, all areas are chronic   Patient tolerated the procedure well with improvement in symptoms  Patient given exercises, stretches and lifestyle modifications  See medications in  patient instructions if given  Patient will follow up in 4-8 weeks  The above documentation has been reviewed and is accurate and complete Susan Stephens M Susan Haren, DO

## 2023-09-02 ENCOUNTER — Encounter: Payer: Self-pay | Admitting: Family Medicine

## 2023-09-02 ENCOUNTER — Ambulatory Visit: Admitting: Family Medicine

## 2023-09-02 VITALS — BP 112/70 | HR 85 | Ht 65.0 in | Wt 154.0 lb

## 2023-09-02 DIAGNOSIS — M9903 Segmental and somatic dysfunction of lumbar region: Secondary | ICD-10-CM | POA: Diagnosis not present

## 2023-09-02 DIAGNOSIS — M9902 Segmental and somatic dysfunction of thoracic region: Secondary | ICD-10-CM | POA: Diagnosis not present

## 2023-09-02 DIAGNOSIS — M9908 Segmental and somatic dysfunction of rib cage: Secondary | ICD-10-CM

## 2023-09-02 DIAGNOSIS — M2550 Pain in unspecified joint: Secondary | ICD-10-CM | POA: Diagnosis not present

## 2023-09-02 DIAGNOSIS — M7062 Trochanteric bursitis, left hip: Secondary | ICD-10-CM | POA: Diagnosis not present

## 2023-09-02 DIAGNOSIS — M533 Sacrococcygeal disorders, not elsewhere classified: Secondary | ICD-10-CM | POA: Diagnosis not present

## 2023-09-02 LAB — COMPREHENSIVE METABOLIC PANEL WITH GFR
ALT: 39 U/L — ABNORMAL HIGH (ref 0–35)
AST: 30 U/L (ref 0–37)
Albumin: 4.7 g/dL (ref 3.5–5.2)
Alkaline Phosphatase: 65 U/L (ref 39–117)
BUN: 20 mg/dL (ref 6–23)
CO2: 29 meq/L (ref 19–32)
Calcium: 9.6 mg/dL (ref 8.4–10.5)
Chloride: 104 meq/L (ref 96–112)
Creatinine, Ser: 0.85 mg/dL (ref 0.40–1.20)
GFR: 74.18 mL/min (ref >60.00)
Glucose, Bld: 89 mg/dL (ref 70–99)
Potassium: 4.3 meq/L (ref 3.5–5.1)
Sodium: 141 meq/L (ref 135–145)
Total Bilirubin: 0.6 mg/dL (ref 0.2–1.2)
Total Protein: 7.2 g/dL (ref 6.0–8.3)

## 2023-09-02 LAB — CBC WITH DIFFERENTIAL/PLATELET
Basophils Absolute: 0 10*3/uL (ref 0.0–0.1)
Basophils Relative: 0.4 % (ref 0.0–3.0)
Eosinophils Absolute: 0.2 10*3/uL (ref 0.0–0.7)
Eosinophils Relative: 3.5 % (ref 0.0–5.0)
HCT: 44.4 % (ref 36.0–46.0)
Hemoglobin: 14.9 g/dL (ref 12.0–15.0)
Lymphocytes Relative: 33.9 % (ref 12.0–46.0)
Lymphs Abs: 1.9 10*3/uL (ref 0.7–4.0)
MCHC: 33.6 g/dL (ref 30.0–36.0)
MCV: 91.2 fl (ref 78.0–100.0)
Monocytes Absolute: 0.5 10*3/uL (ref 0.1–1.0)
Monocytes Relative: 9.3 % (ref 3.0–12.0)
Neutro Abs: 2.9 10*3/uL (ref 1.4–7.7)
Neutrophils Relative %: 52.9 % (ref 43.0–77.0)
Platelets: 331 10*3/uL (ref 150.0–400.0)
RBC: 4.86 Mil/uL (ref 3.87–5.11)
RDW: 13.4 % (ref 11.5–15.5)
WBC: 5.5 10*3/uL (ref 4.0–10.5)

## 2023-09-02 LAB — VITAMIN D 25 HYDROXY (VIT D DEFICIENCY, FRACTURES): VITD: 30.51 ng/mL (ref 30.00–100.00)

## 2023-09-02 LAB — IBC PANEL
Iron: 137 ug/dL (ref 42–145)
Saturation Ratios: 40.4 % (ref 20.0–50.0)
TIBC: 338.8 ug/dL (ref 250.0–450.0)
Transferrin: 242 mg/dL (ref 212.0–360.0)

## 2023-09-02 LAB — URIC ACID: Uric Acid, Serum: 5.2 mg/dL (ref 2.4–7.0)

## 2023-09-02 LAB — VITAMIN B12: Vitamin B-12: 411 pg/mL (ref 211–911)

## 2023-09-02 LAB — FERRITIN: Ferritin: 142.1 ng/mL (ref 10.0–291.0)

## 2023-09-02 LAB — TSH: TSH: 2.94 u[IU]/mL (ref 0.35–5.50)

## 2023-09-02 NOTE — Patient Instructions (Addendum)
 Injection in L hip Continue to be active Labs today See you again in 2 months

## 2023-09-02 NOTE — Assessment & Plan Note (Signed)
 Has did well to home exercises and icing regimen.  Patient is responding well to osteopathic manipulation.  Hopeful that this will make significant improvement.  Follow-up again 6 to 8 weeks.

## 2023-09-02 NOTE — Assessment & Plan Note (Signed)
 Repeat injection given, discussed icing regimen and home exercises, discussed which activities to do and which ones to avoid.  Increase activity strain.  Discussed icing regimen.  Discussed with patient she will do well with conservative therapy.  Begin in 6 to 8 weeks

## 2023-09-03 ENCOUNTER — Encounter: Payer: Self-pay | Admitting: Family Medicine

## 2023-09-03 LAB — LIPID PANEL
Cholesterol: 214 mg/dL — ABNORMAL HIGH (ref <200)
HDL: 53 mg/dL (ref >=50)
LDL Cholesterol (Calc): 132 mg/dL — ABNORMAL HIGH
Non-HDL Cholesterol (Calc): 161 mg/dL — ABNORMAL HIGH (ref <130)
Total CHOL/HDL Ratio: 4 (calc) (ref <5.0)
Triglycerides: 156 mg/dL — ABNORMAL HIGH (ref <150)

## 2023-09-07 LAB — PTH, INTACT AND CALCIUM
Calcium: 9.8 mg/dL (ref 8.6–10.4)
PTH: 20 pg/mL (ref 16–77)

## 2023-09-24 ENCOUNTER — Ambulatory Visit: Admitting: Family Medicine

## 2023-10-14 NOTE — Progress Notes (Signed)
 Susan Stephens [address] [place] [address] Phone: [phone] Subjective:    I'm seeing this patient by the request  of:  Helaine Llanos, MD  CC: Left hip pain  [name]  Susan Stephens is a 63 y.o. female coming in with complaint of back and neck pain. OMT 09/02/2023. Also f/u for L hip pain.  Given repeat injection also for the greater trochanteric area on the left side in April.  Patient states that she had relief from injection for about 3 weeks. States that at night and driving increases her pain. Pain starts in SI joint into the GT and quad.   Medications patient has been prescribed: Meloxicam   Taking:     Vitamin D  30      Reviewed prior external information including notes and imaging from previsou exam, outside providers and external EMR if available.   As well as notes that were available from care everywhere and other healthcare systems.  Past medical history, social, surgical and family history all reviewed in electronic medical record.  No pertanent information unless stated regarding to the chief complaint.   No past medical history on file.  Allergies  Allergen Reactions   Latex     Causes irritation     Review of Systems:  No headache, visual changes, nausea, vomiting, diarrhea, constipation, dizziness, abdominal pain, skin rash, fevers, chills, night sweats, weight loss, swollen lymph nodes, body aches, joint swelling, chest pain, shortness of breath, mood changes. POSITIVE muscle aches  Objective  Blood pressure 112/78, pulse 64, height 5' 5 (1.651 m), weight 154 lb (69.9 kg), SpO2 97%.   General: No apparent distress alert and oriented x3 mood and affect normal, dressed appropriately.  HEENT: Pupils equal, extraocular movements intact  Respiratory: Patient's speak in full sentences and does not appear short of breath  Cardiovascular: No lower extremity edema, non tender, no erythema  Gait MSK:  Back  back does have some loss lordosis.  Significant stiffness noted over the left hip.  Still has some piriformis syndrome noted bilaterally but seems to be more tender over the gluteal tendon on the left.  Osteopathic findings  C2 flexed rotated and side bent right C6 flexed rotated and side bent left T3 extended rotated and side bent right inhaled rib T9 extended rotated and side bent left L2 flexed rotated and side bent right L3 flexed rotated and side bent left. Sacrum right on right  Procedure: Real-time Ultrasound Guided Injection of left gluteal tendon sheath Device: GE Logiq Q7 Ultrasound guided injection is preferred based studies that show increased duration, increased effect, greater accuracy, decreased procedural pain, increased response rate, and decreased cost with ultrasound guided versus blind injection.  Verbal informed consent obtained.  Time-out conducted.  Noted no overlying erythema, induration, or other signs of local infection.  Skin prepped in a sterile fashion.  Local anesthesia: Topical Ethyl chloride.  With sterile technique and under real time ultrasound guidance: A 25-gauge half inch needle injected with 0.5 cc 0.5% Marcaine and 0.5 cc of Kenalog  40 mg/mL Completed without difficulty  Pain immediately resolved suggesting accurate placement of the medication.  Advised to call if fevers/chills, erythema, induration, drainage, or persistent bleeding.  Impression: Technically successful ultrasound guided injection.   Assessment and Plan:  Gluteal tendonitis of left buttock Given injection today and tolerated the procedure well, discussed icing regimen of home exercises, icing regimen.  Patient will keep working on hip abductor strengthening.  Discussed with patient  the idea of potential MRI of the lumbar and hip if this continues to give her difficulty with it being potentially more of a referred pain.  Patient is in agreement with the plan will continue home  exercises of meloxicam  15 mg daily as needed.  Follow-up again in 2 to 3 months    Nonallopathic problems  Decision today to treat with OMT was based on Physical Exam  After verbal consent patient was treated with HVLA, ME, FPR techniques in cervical, rib, thoracic, lumbar, and sacral  areas avoided any HVLA on the cervical region  Patient tolerated the procedure well with improvement in symptoms  Patient given exercises, stretches and lifestyle modifications  See medications in patient instructions if given  Patient will follow up in 4-8 weeks     The above documentation has been reviewed and is accurate and complete Susan Hiscox M Londynn Sonoda, DO         Note: This dictation was prepared with Dragon dictation along with smaller phrase technology. Any transcriptional errors that result from this process are unintentional.

## 2023-10-16 ENCOUNTER — Other Ambulatory Visit: Payer: Self-pay

## 2023-10-16 ENCOUNTER — Ambulatory Visit (INDEPENDENT_AMBULATORY_CARE_PROVIDER_SITE_OTHER): Admitting: Family Medicine

## 2023-10-16 ENCOUNTER — Encounter: Payer: Self-pay | Admitting: Family Medicine

## 2023-10-16 VITALS — BP 112/78 | HR 64 | Ht 65.0 in | Wt 154.0 lb

## 2023-10-16 DIAGNOSIS — G894 Chronic pain syndrome: Secondary | ICD-10-CM

## 2023-10-16 DIAGNOSIS — M9902 Segmental and somatic dysfunction of thoracic region: Secondary | ICD-10-CM | POA: Diagnosis not present

## 2023-10-16 DIAGNOSIS — M9903 Segmental and somatic dysfunction of lumbar region: Secondary | ICD-10-CM

## 2023-10-16 DIAGNOSIS — M9908 Segmental and somatic dysfunction of rib cage: Secondary | ICD-10-CM

## 2023-10-16 DIAGNOSIS — M25552 Pain in left hip: Secondary | ICD-10-CM | POA: Diagnosis not present

## 2023-10-16 DIAGNOSIS — M9901 Segmental and somatic dysfunction of cervical region: Secondary | ICD-10-CM | POA: Diagnosis not present

## 2023-10-16 DIAGNOSIS — M7602 Gluteal tendinitis, left hip: Secondary | ICD-10-CM | POA: Insufficient documentation

## 2023-10-16 DIAGNOSIS — M9904 Segmental and somatic dysfunction of sacral region: Secondary | ICD-10-CM

## 2023-10-16 MED ORDER — MELOXICAM 15 MG PO TABS: 15.0000 mg | ORAL_TABLET | Freq: Every day | ORAL | 0 refills | Status: DC

## 2023-10-16 NOTE — Assessment & Plan Note (Signed)
 Given injection today and tolerated the procedure well, discussed icing regimen of home exercises, icing regimen.  Patient will keep working on hip abductor strengthening.  Discussed with patient the idea of potential MRI of the lumbar and hip if this continues to give her difficulty with it being potentially more of a referred pain.  Patient is in agreement with the plan will continue home exercises of meloxicam  15 mg daily as needed.  Follow-up again in 2 to 3 months

## 2023-10-16 NOTE — Assessment & Plan Note (Signed)
 Meloxicam  prescribed 15 mg daily

## 2023-10-16 NOTE — Patient Instructions (Signed)
 Injected glute today Meloxicam  called in

## 2023-11-02 ENCOUNTER — Encounter: Payer: Self-pay | Admitting: Family Medicine

## 2023-11-02 ENCOUNTER — Other Ambulatory Visit: Payer: Self-pay

## 2023-11-02 DIAGNOSIS — M25552 Pain in left hip: Secondary | ICD-10-CM

## 2023-11-04 ENCOUNTER — Ambulatory Visit
Admission: RE | Admit: 2023-11-04 | Discharge: 2023-11-04 | Disposition: A | Discharge status: Home / Self Care | Source: Ambulatory Visit | Attending: Family Medicine | Admitting: Family Medicine

## 2023-11-04 ENCOUNTER — Encounter: Payer: Self-pay | Admitting: Family Medicine

## 2023-11-04 DIAGNOSIS — M25552 Pain in left hip: Secondary | ICD-10-CM

## 2023-11-04 MED ORDER — DIAZEPAM 5 MG PO TABS: ORAL_TABLET | ORAL | 0 refills | Status: AC

## 2023-11-05 ENCOUNTER — Ambulatory Visit: Payer: Self-pay | Admitting: Family Medicine

## 2023-11-10 ENCOUNTER — Other Ambulatory Visit

## 2023-12-14 ENCOUNTER — Encounter: Payer: Self-pay | Admitting: Family Medicine

## 2023-12-22 ENCOUNTER — Other Ambulatory Visit: Payer: Self-pay

## 2023-12-22 MED ORDER — MELOXICAM 15 MG PO TABS: 15.0000 mg | ORAL_TABLET | Freq: Every day | ORAL | 0 refills | Status: DC

## 2023-12-22 MED ORDER — PREDNISONE 20 MG PO TABS: 40.0000 mg | ORAL_TABLET | Freq: Every day | ORAL | 0 refills | Status: DC

## 2024-02-22 ENCOUNTER — Encounter: Payer: Self-pay | Admitting: Family Medicine

## 2024-02-23 ENCOUNTER — Other Ambulatory Visit: Payer: Self-pay

## 2024-02-23 MED ORDER — PREDNISONE 20 MG PO TABS: 40.0000 mg | ORAL_TABLET | Freq: Every day | ORAL | 0 refills | Status: DC

## 2024-03-05 ENCOUNTER — Other Ambulatory Visit: Payer: Self-pay

## 2024-03-05 ENCOUNTER — Emergency Department (HOSPITAL_COMMUNITY)
Admission: EM | Admit: 2024-03-05 | Discharge: 2024-03-05 | Disposition: A | Discharge status: Home / Self Care | Attending: Emergency Medicine | Admitting: Emergency Medicine

## 2024-03-05 ENCOUNTER — Emergency Department (HOSPITAL_COMMUNITY)

## 2024-03-05 ENCOUNTER — Encounter (HOSPITAL_COMMUNITY): Payer: Self-pay | Admitting: *Deleted

## 2024-03-05 DIAGNOSIS — R42 Dizziness and giddiness: Secondary | ICD-10-CM | POA: Insufficient documentation

## 2024-03-05 DIAGNOSIS — R112 Nausea with vomiting, unspecified: Secondary | ICD-10-CM | POA: Insufficient documentation

## 2024-03-05 DIAGNOSIS — Z91040 Latex allergy status: Secondary | ICD-10-CM | POA: Insufficient documentation

## 2024-03-05 LAB — COMPREHENSIVE METABOLIC PANEL WITH GFR
ALT: 39 U/L (ref 0–44)
AST: 27 U/L (ref 15–41)
Albumin: 4 g/dL (ref 3.5–5.0)
Alkaline Phosphatase: 62 U/L (ref 38–126)
Anion gap: 12 (ref 5–15)
BUN: 21 mg/dL (ref 8–23)
CO2: 23 mmol/L (ref 22–32)
Calcium: 8.9 mg/dL (ref 8.9–10.3)
Chloride: 105 mmol/L (ref 98–111)
Creatinine, Ser: 0.99 mg/dL (ref 0.44–1.00)
GFR, Estimated: >60 mL/min (ref >60)
Glucose, Bld: 106 mg/dL — ABNORMAL HIGH (ref 70–99)
Potassium: 3.7 mmol/L (ref 3.5–5.1)
Sodium: 140 mmol/L (ref 135–145)
Total Bilirubin: 0.7 mg/dL (ref 0.0–1.2)
Total Protein: 6.6 g/dL (ref 6.5–8.1)

## 2024-03-05 LAB — CBC
HCT: 46.1 % — ABNORMAL HIGH (ref 36.0–46.0)
Hemoglobin: 15.1 g/dL — ABNORMAL HIGH (ref 12.0–15.0)
MCH: 30.4 pg (ref 26.0–34.0)
MCHC: 32.8 g/dL (ref 30.0–36.0)
MCV: 92.9 fL (ref 80.0–100.0)
Platelets: 339 K/uL (ref 150–400)
RBC: 4.96 MIL/uL (ref 3.87–5.11)
RDW: 12 % (ref 11.5–15.5)
WBC: 9.3 K/uL (ref 4.0–10.5)
nRBC: 0 % (ref 0.0–0.2)

## 2024-03-05 LAB — CBG MONITORING, ED: Glucose-Capillary: 108 mg/dL — ABNORMAL HIGH (ref 70–99)

## 2024-03-05 MED ORDER — SODIUM CHLORIDE 0.9 % IV BOLUS
1000.0000 mL | Freq: Once | INTRAVENOUS | Status: AC
  Administered 2024-03-05: 1000 mL via INTRAVENOUS

## 2024-03-05 MED ORDER — PROCHLORPERAZINE EDISYLATE 10 MG/2ML IJ SOLN
5.0000 mg | Freq: Once | INTRAMUSCULAR | Status: AC
Start: 2024-03-05 — End: 2024-03-05
  Administered 2024-03-05: 5 mg via INTRAVENOUS
  Filled 2024-03-05: qty 2

## 2024-03-05 MED ORDER — MECLIZINE HCL 25 MG PO TABS
50.0000 mg | ORAL_TABLET | Freq: Once | ORAL | Status: AC | PRN
  Administered 2024-03-05: 50 mg via ORAL
  Filled 2024-03-05: qty 2

## 2024-03-05 MED ORDER — IOHEXOL 350 MG/ML SOLN
75.0000 mL | Freq: Once | INTRAVENOUS | Status: AC | PRN
  Administered 2024-03-05: 75 mL via INTRAVENOUS

## 2024-03-05 MED ORDER — ONDANSETRON 4 MG PO TBDP: 4.0000 mg | ORAL_TABLET | Freq: Three times a day (TID) | ORAL | 0 refills | Status: AC | PRN

## 2024-03-05 MED ORDER — MECLIZINE HCL 25 MG PO TABS: 50.0000 mg | ORAL_TABLET | Freq: Three times a day (TID) | ORAL | 0 refills | Status: AC | PRN

## 2024-03-05 NOTE — ED Triage Notes (Signed)
 Pt here via [facility] for acute onset vomiting and dizziness while driving.  Denies headache, though states light makes the dizziness worse.  Given 4 mg zofran  and 100 ml IV ns with some improvement.  Stroke screen negative per EMS.  Bp 132/76 Hr 64 Rr 16 Spo2 100% RA Cbg 109  Pt ao x 4.  States just finished round of prednisone  last week for hip pain.

## 2024-03-05 NOTE — ED Notes (Signed)
 Pt ambulatory to restroom

## 2024-03-05 NOTE — ED Provider Notes (Signed)
 [name]   CSN: [id number] Arrival date & time: 03/05/24  1251     Patient presents with: Dizziness and Emesis   Susan Stephens is a 63 y.o. female patient with past medical history of chronic back pain, dyslipidemia presents to emergency room with complaint of nausea vomiting and dizziness.  Patient reports that approximately noon today, after eating lunch, she was driving her car and started experiencing severe lower abdominal pain, reports that the pain was so bad that she felt like she was going to pass out.  She pulled the car over and then lost consciousness, which was witnessed by passenger -no noted seizure activity and did not fall/hit head, lasted a few minutes. After this episode, pain resolved and she started having dizziness like the room is spinning, NV. Dizziness worse with moving. Never had this before. Denies HA, AMS, focal weakness/numbness, vision change, speech change.     Dizziness Associated symptoms: vomiting   Emesis      Prior to Admission medications   Medication Sig Start Date End Date Taking? Authorizing Provider  acetaminophen (TYLENOL) 325 MG tablet Take 650 mg by mouth every 6 (six) hours as needed for mild pain, moderate pain or headache.    [provider]  diazepam  (VALIUM ) 5 MG tablet One tab by mouth, 2 hours before procedure. 11/04/23   Susan Arthea HERO, DO  Ibuprofen -Famotidine  800-26.6 MG TABS Take 1 tablet three times daily as needed. 04/09/15   Susan Arthea HERO, DO  meloxicam  (MOBIC ) 15 MG tablet Take 1 tablet (15 mg total) by mouth daily. 12/22/23   Susan Arthea HERO, DO  predniSONE  (DELTASONE ) 20 MG tablet Take 2 tablets (40 mg total) by mouth daily with breakfast. 02/23/24   Susan Arthea HERO, DO  Vitamin D , Ergocalciferol , (DRISDOL ) 50000 UNITS CAPS capsule Take by mouth.    [provider]  Vitamin D , Ergocalciferol , (DRISDOL ) 50000 units CAPS capsule Take 1 capsule  (50,000 Units total) by mouth every 7 (seven) days. 08/01/15   Susan Arthea HERO, DO    Allergies: Latex    Review of Systems  Gastrointestinal:  Positive for vomiting.  Neurological:  Positive for dizziness.    Updated Vital Signs BP (!) 152/82   Pulse 68   Temp 97.7 F (36.5 C) (Oral)   Resp 16   Ht 5' 5 (1.651 m)   Wt 69.9 kg   SpO2 100%   BMI 25.64 kg/m   Physical Exam Vitals and nursing note reviewed.  Constitutional:      General: She is not in acute distress.    Appearance: She is not toxic-appearing.  HENT:     Head: Normocephalic and atraumatic.  Eyes:     General: No scleral icterus.    Conjunctiva/sclera: Conjunctivae normal.  Cardiovascular:     Rate and Rhythm: Normal rate and regular rhythm.     Pulses: Normal pulses.     Heart sounds: Normal heart sounds.  Pulmonary:     Effort: Pulmonary effort is normal. No respiratory distress.     Breath sounds: Normal breath sounds.  Abdominal:     General: Abdomen is flat. Bowel sounds are normal.     Palpations: Abdomen is soft.     Tenderness: There is no abdominal tenderness.     Comments: No abdominal tenderness.  Abdomen is soft and nondistended.  Skin:    General: Skin is warm and dry.     Findings: No lesion.  Neurological:     General: No focal deficit present.     Mental Status: She is alert and oriented to person, place, and time. Mental status is at baseline.     Comments: Patient is alert and oriented x 4 with no slurred speech. Moving extremities without difficulty, sensation intact.  Strong radial pulse equal bilaterally.  Strong dorsal pedal pulse equal bilaterally.     (all labs ordered are listed, but only abnormal results are displayed) Labs Reviewed  COMPREHENSIVE METABOLIC PANEL WITH GFR - Abnormal; Notable for the following components:      Result Value   Glucose, Bld 106 (*)    All other components within normal limits  CBC - Abnormal; Notable for the following components:    Hemoglobin 15.1 (*)    HCT 46.1 (*)    All other components within normal limits  CBG MONITORING, ED - Abnormal; Notable for the following components:   Glucose-Capillary 108 (*)    All other components within normal limits  URINALYSIS, ROUTINE W REFLEX MICROSCOPIC    EKG: EKG Interpretation Date/Time:  Saturday March 05 2024 12:57:57 EDT Ventricular Rate:  72 PR Interval:  160 QRS Duration:  98 QT Interval:  428 QTC Calculation: 468 R Axis:   51  Text Interpretation: Normal sinus rhythm Incomplete right bundle branch block Borderline ECG No previous ECGs available Confirmed by Susan Stephens [phone]) on 03/05/2024 2:23:45 PM  Radiology: CT HEAD WO CONTRAST Result Date: 03/05/2024 EXAM: CT HEAD WITHOUT CONTRAST 03/05/2024 01:45:35 PM TECHNIQUE: CT of the head was performed without the administration of intravenous contrast. Automated exposure control, iterative reconstruction, and/or weight based adjustment of the mA/kV was utilized to reduce the radiation dose to as low as reasonably achievable. COMPARISON: None available. CLINICAL HISTORY: Syncope/presyncope, cerebrovascular cause suspected. FINDINGS: BRAIN AND VENTRICLES: No acute hemorrhage. No evidence of acute infarct. No hydrocephalus. No extra-axial collection. No mass effect or midline shift. Cavum septum pellucidum, normal variant. ORBITS: No acute abnormality. SINUSES: Mucosal thickening in maxillary sinuses. SOFT TISSUES AND SKULL: No acute soft tissue abnormality. No skull fracture. IMPRESSION: 1. No acute intracranial abnormality. Electronically signed by: Susan Stanford MD 03/05/2024 01:52 PM EDT RP Workstation: [id number]     Procedures   Medications Ordered in the ED  meclizine (ANTIVERT) tablet 50 mg (has no administration in time range)  prochlorperazine (COMPAZINE) injection 5 mg (5 mg Intravenous Given 03/05/24 1438)  sodium chloride 0.9 % bolus 1,000 mL (1,000 mLs Intravenous New Bag/Given 03/05/24 1442)     Clinical Course as of 03/05/24 1637  Sat Mar 05, 2024  1633 Reports symptoms have resolved after Compazine and meclizine.  She was able to ambulate with steady gait in the hallway. [JB]  1633 On repeat abdominal exam she continues to have nontender, nondistended abdomen.  She has negative Rovsing's, negative McBurney's [JB]    Clinical Course User Index [JB] Murriel Eidem, Warren SAILOR, PA-C                                 Medical Decision Making Amount and/or Complexity of Data Reviewed Labs: ordered. Radiology: ordered.  Risk Prescription drug management.   This patient presents to the ED for concern of dizziness, this involves an extensive number of treatment options, and is a complaint that carries with it a high risk of complications and morbidity.  The differential diagnosis includes dehydration, electrolyte abnormality, ACS, PE, intercranial abnormality, aortic syndrome, appendicitis, kidney stone,  UTI   Co morbidities that complicate the patient evaluation  HLD   Lab Tests:  I personally interpreted labs.  The pertinent results include:   No leukocytosis, hemoglobin is 15.1 CMP is grossly unremarkable, CBG 108   Imaging Studies ordered:  I ordered imaging studies including CT head, CT angio head & neck, CT abd/pelvis  I independently visualized and interpreted imaging which showed no acute findings of head CT nor CT angio head and neck.  CT of abdomen and pelvis shows mildly dilated appendix recommending clinical correlation. I agree with the radiologist interpretation   Cardiac Monitoring: / EKG:  The patient was maintained on a cardiac monitor.  I personally viewed and interpreted the cardiac monitored which showed an underlying rhythm of: sinus, incomplete RBB    Problem List / ED Course / Critical interventions / Medication management  Patient presents emergency room with complaint of nausea and vomiting.  She reports on this for started she was having severe  abdominal pain which is now resolved.  She reports that the pain is so bad she felt like she was going to pass out.  On arrival she is hemodynamically stable and well-appearing.  She has no focal neurological deficits.  Her abdominal pain has resolved.  She has nondistended nontender abdomen.  Her CT scan does question if she has mildly dilated appendix however I feel appendicitis is less likely given she is no longer having abdominal pain and abdominal exams without any tenderness or rebound tenderness.  CT head and CT angio head and neck are negative for acute findings.  Her symptoms have completely resolved after receiving meclizine.  She is able to ambulate with steady gait and no focal deficits. I ordered medication including compazine, NS, will trial meclizine.  Reevaluation of the patient after these medicines showed that the patient resolved I have reviewed the patients home medicines and have made adjustments as needed. Reassuring workup here.  Symptoms are resolved.  Ambulated with steady gait.  Feel appropriate for discharge with outpatient follow-up.  Given strict return precautions.        Final diagnoses:  Nausea and vomiting, unspecified vomiting type  Vertigo    ED Discharge Orders          Ordered    ondansetron  (ZOFRAN -ODT) 4 MG disintegrating tablet  Every 8 hours PRN        03/05/24 1637    meclizine (ANTIVERT) 25 MG tablet  3 times daily PRN        03/05/24 1637               Medford Staheli, Warren SAILOR, PA-C 03/05/24 1639    Susan Ozell BROCKS, MD 03/05/24 2032

## 2024-03-05 NOTE — Discharge Instructions (Signed)
 Please follow-up with your primary care.  Call Monday to schedule appointment.  Take Zofran  as needed for nausea and vomiting.  Take meclizine as needed for dizziness.  Return to emergency room with new or worsening symptoms.  If you start having new or worsening abdominal pain and right lower quadrant pain please consider returning for repeat evaluation.

## 2024-03-05 NOTE — ED Provider Triage Note (Signed)
 Emergency Medicine Provider Triage Evaluation Note  Susan Stephens , a 63 y.o. female  was evaluated in triage.  Pt complains of cute weak all over tingling all over nausea and vomiting, light sensitivity while driving.  Thinks she may have passed out for a minute.  Recent steroids for chronic hip problems..  Review of Systems  Positive: Dizziness Negative: Focal weakness  Physical Exam  BP (!) 152/82   Pulse 68   Temp 97.7 F (36.5 C) (Oral)   Resp 16   Ht 5' 5 (1.651 m)   Wt 69.9 kg   SpO2 100%   BMI 25.64 kg/m  Gen:   Awake, no distress   Resp:  Normal effort  MSK:   Moves extremities without difficulty  Other:    Medical Decision Making  Medically screening exam initiated at 1:12 PM.  Appropriate orders placed.  Susan Stephens was informed that the remainder of the evaluation will be completed by another provider, this initial triage assessment does not replace that evaluation, and the importance of remaining in the ED until their evaluation is complete.     Susan Ozell BROCKS, MD 03/05/24 [phone]

## 2024-03-05 NOTE — ED Notes (Signed)
 Pt says she is unable to give urine sample at this time. Pt aware to press call light when she is able

## 2024-03-14 ENCOUNTER — Ambulatory Visit: Admitting: Family Medicine

## 2024-03-29 ENCOUNTER — Encounter: Payer: Self-pay | Admitting: Family Medicine

## 2024-03-29 ENCOUNTER — Other Ambulatory Visit: Payer: Self-pay

## 2024-03-29 MED ORDER — MELOXICAM 15 MG PO TABS: 15.0000 mg | ORAL_TABLET | Freq: Every day | ORAL | 0 refills | Status: DC

## 2024-03-29 MED ORDER — PREDNISONE 20 MG PO TABS: 40.0000 mg | ORAL_TABLET | Freq: Every day | ORAL | 0 refills | Status: DC

## 2024-06-08 ENCOUNTER — Ambulatory Visit: Admitting: Family Medicine

## 2024-06-08 ENCOUNTER — Encounter: Payer: Self-pay | Admitting: Family Medicine

## 2024-06-08 VITALS — BP 124/70 | HR 77 | Ht 65.0 in | Wt 155.0 lb

## 2024-06-08 DIAGNOSIS — M9908 Segmental and somatic dysfunction of rib cage: Secondary | ICD-10-CM | POA: Diagnosis not present

## 2024-06-08 DIAGNOSIS — M9902 Segmental and somatic dysfunction of thoracic region: Secondary | ICD-10-CM | POA: Diagnosis not present

## 2024-06-08 DIAGNOSIS — M5127 Other intervertebral disc displacement, lumbosacral region: Secondary | ICD-10-CM

## 2024-06-08 DIAGNOSIS — M9901 Segmental and somatic dysfunction of cervical region: Secondary | ICD-10-CM

## 2024-06-08 DIAGNOSIS — M9903 Segmental and somatic dysfunction of lumbar region: Secondary | ICD-10-CM | POA: Diagnosis not present

## 2024-06-08 DIAGNOSIS — M9904 Segmental and somatic dysfunction of sacral region: Secondary | ICD-10-CM | POA: Diagnosis not present

## 2024-06-08 MED ORDER — PREDNISONE 20 MG PO TABS: 40.0000 mg | ORAL_TABLET | Freq: Every day | ORAL | 0 refills | Status: AC

## 2024-06-08 MED ORDER — TIZANIDINE HCL 4 MG PO TABS: 4.0000 mg | ORAL_TABLET | Freq: Every day | ORAL | 1 refills | Status: AC

## 2024-06-08 MED ORDER — MELOXICAM 15 MG PO TABS: 15.0000 mg | ORAL_TABLET | Freq: Every day | ORAL | 0 refills | Status: AC

## 2024-06-08 NOTE — Patient Instructions (Addendum)
 Good to see you. Meloxicam  90 days. Prednisone  40 mg for 10 days. Zanaflex  4 mg 30 with 1 refill.  See me again as needed.

## 2024-06-08 NOTE — Assessment & Plan Note (Addendum)
 Discussed with patient. Discussed HEP, discussed zanaflex  to see if it helps Continue to be active  F/u in 6-8 weeks  Refilled prednisone  for patient for traveling.  Meloxicam  for 5-day burst as well.

## 2024-06-14 ENCOUNTER — Ambulatory Visit: Admitting: Family Medicine

## 2024-06-24 ENCOUNTER — Ambulatory Visit: Admitting: Family Medicine
